# Patient Record
Sex: Female | Born: 1975 | Race: White | Hispanic: No | Marital: Married | Smoking: Former smoker
Health system: Southern US, Community
[De-identification: ages and names within clinical notes are randomized; demographics above are authoritative.]

## PROBLEM LIST (undated history)

## (undated) DIAGNOSIS — I471 Supraventricular tachycardia, unspecified: Secondary | ICD-10-CM

## (undated) DIAGNOSIS — R87612 Low grade squamous intraepithelial lesion on cytologic smear of cervix (LGSIL): Secondary | ICD-10-CM

## (undated) DIAGNOSIS — B009 Herpesviral infection, unspecified: Secondary | ICD-10-CM

## (undated) HISTORY — DX: Supraventricular tachycardia: I47.1

## (undated) HISTORY — DX: Supraventricular tachycardia, unspecified: I47.10

## (undated) HISTORY — PX: BARIATRIC SURGERY: SHX1103

## (undated) HISTORY — DX: Low grade squamous intraepithelial lesion on cytologic smear of cervix (LGSIL): R87.612

## (undated) HISTORY — PX: WISDOM TOOTH EXTRACTION: SHX21

## (undated) HISTORY — DX: Herpesviral infection, unspecified: B00.9

---

## 2006-10-24 ENCOUNTER — Ambulatory Visit: Payer: Self-pay | Admitting: Family Medicine

## 2008-04-12 ENCOUNTER — Ambulatory Visit: Payer: Self-pay | Admitting: Family Medicine

## 2008-04-13 ENCOUNTER — Ambulatory Visit: Payer: Self-pay | Admitting: Family Medicine

## 2009-08-10 ENCOUNTER — Ambulatory Visit: Payer: Self-pay | Admitting: Internal Medicine

## 2010-07-27 ENCOUNTER — Ambulatory Visit: Payer: Self-pay | Admitting: Family Medicine

## 2012-04-29 ENCOUNTER — Ambulatory Visit: Payer: Self-pay | Admitting: Bariatrics

## 2012-04-29 LAB — CBC WITH DIFFERENTIAL/PLATELET
Basophil #: 0 10*3/uL (ref 0.0–0.1)
Basophil %: 0.2 %
Eosinophil #: 0.1 10*3/uL (ref 0.0–0.7)
HCT: 38.9 % (ref 35.0–47.0)
HGB: 13.2 g/dL (ref 12.0–16.0)
Lymphocyte #: 2.9 10*3/uL (ref 1.0–3.6)
Lymphocyte %: 28.3 %
MCH: 31.1 pg (ref 26.0–34.0)
MCV: 91 fL (ref 80–100)
Neutrophil #: 6.5 10*3/uL (ref 1.4–6.5)
Neutrophil %: 64.2 %
Platelet: 267 10*3/uL (ref 150–440)
RBC: 4.26 10*6/uL (ref 3.80–5.20)
WBC: 10.1 10*3/uL (ref 3.6–11.0)

## 2012-04-29 LAB — COMPREHENSIVE METABOLIC PANEL
Albumin: 3.8 g/dL (ref 3.4–5.0)
Alkaline Phosphatase: 67 U/L (ref 50–136)
BUN: 13 mg/dL (ref 7–18)
Calcium, Total: 9 mg/dL (ref 8.5–10.1)
Co2: 29 mmol/L (ref 21–32)
Creatinine: 0.69 mg/dL (ref 0.60–1.30)
EGFR (African American): >60
Glucose: 84 mg/dL (ref 65–99)
Potassium: 3.9 mmol/L (ref 3.5–5.1)
SGPT (ALT): 28 U/L
Sodium: 136 mmol/L (ref 136–145)
Total Protein: 7.6 g/dL (ref 6.4–8.2)

## 2012-04-29 LAB — IRON AND TIBC: Iron Bind.Cap.(Total): 406 ug/dL (ref 250–450)

## 2012-04-29 LAB — MAGNESIUM: Magnesium: 1.9 mg/dL

## 2012-04-29 LAB — HEMOGLOBIN A1C: Hemoglobin A1C: 5.6 % (ref 4.2–6.3)

## 2012-04-29 LAB — PHOSPHORUS: Phosphorus: 3.3 mg/dL (ref 2.5–4.9)

## 2012-04-29 LAB — PROTIME-INR
INR: 0.9
Prothrombin Time: 12.5 secs (ref 11.5–14.7)

## 2012-04-29 LAB — HEPATIC FUNCTION PANEL A (ARMC): Bilirubin, Direct: <0.1 mg/dL (ref 0.00–0.20)

## 2012-04-29 LAB — TSH: Thyroid Stimulating Horm: 1.04 u[IU]/mL

## 2012-04-29 LAB — AMYLASE: Amylase: 29 U/L (ref 25–115)

## 2012-04-29 LAB — FERRITIN: Ferritin (ARMC): 69 ng/mL (ref 8–388)

## 2013-12-02 ENCOUNTER — Emergency Department: Payer: Self-pay | Admitting: Internal Medicine

## 2013-12-02 LAB — URINALYSIS, COMPLETE
Glucose,UR: NEGATIVE mg/dL (ref 0–75)
Leukocyte Esterase: NEGATIVE
Protein: NEGATIVE
RBC,UR: 2 /HPF (ref 0–5)
Squamous Epithelial: 6
WBC UR: 2 /HPF (ref 0–5)

## 2013-12-02 LAB — COMPREHENSIVE METABOLIC PANEL
Albumin: 3.6 g/dL (ref 3.4–5.0)
Alkaline Phosphatase: 57 U/L
Anion Gap: 8 (ref 7–16)
BUN: 9 mg/dL (ref 7–18)
Bilirubin,Total: 0.5 mg/dL (ref 0.2–1.0)
Co2: 24 mmol/L (ref 21–32)
Creatinine: 0.58 mg/dL — ABNORMAL LOW (ref 0.60–1.30)
EGFR (African American): >60
Glucose: 95 mg/dL (ref 65–99)
Osmolality: 270 (ref 275–301)
Total Protein: 6.8 g/dL (ref 6.4–8.2)

## 2013-12-02 LAB — CBC WITH DIFFERENTIAL/PLATELET
Basophil %: 0.2 %
HCT: 37.1 % (ref 35.0–47.0)
Lymphocyte #: 0.9 10*3/uL — ABNORMAL LOW (ref 1.0–3.6)
MCH: 30.5 pg (ref 26.0–34.0)
MCV: 92 fL (ref 80–100)
Monocyte #: 0.5 x10 3/mm (ref 0.2–0.9)
Monocyte %: 5.6 %
Neutrophil #: 7.3 10*3/uL — ABNORMAL HIGH (ref 1.4–6.5)
Neutrophil %: 83.9 %
RBC: 4.05 10*6/uL (ref 3.80–5.20)
RDW: 12.7 % (ref 11.5–14.5)
WBC: 8.7 10*3/uL (ref 3.6–11.0)

## 2013-12-02 LAB — LIPASE, BLOOD: Lipase: 115 U/L (ref 73–393)

## 2014-08-13 ENCOUNTER — Observation Stay: Payer: Self-pay | Admitting: Obstetrics and Gynecology

## 2014-08-13 LAB — URINALYSIS, COMPLETE
BACTERIA: NONE SEEN
BILIRUBIN, UR: NEGATIVE
BLOOD: NEGATIVE
GLUCOSE, UR: NEGATIVE mg/dL (ref 0–75)
Ketone: NEGATIVE
LEUKOCYTE ESTERASE: NEGATIVE
Nitrite: NEGATIVE
Ph: 6 (ref 4.5–8.0)
Protein: NEGATIVE
RBC, UR: NONE SEEN /HPF (ref 0–5)
SPECIFIC GRAVITY: 1.012 (ref 1.003–1.030)
WBC UR: <1 /HPF (ref 0–5)

## 2014-09-08 ENCOUNTER — Inpatient Hospital Stay: Payer: Self-pay

## 2014-09-08 LAB — CBC WITH DIFFERENTIAL/PLATELET
BASOS PCT: 0.1 %
Basophil #: 0 10*3/uL (ref 0.0–0.1)
EOS ABS: 0.1 10*3/uL (ref 0.0–0.7)
Eosinophil %: 1.1 %
HCT: 30.8 % — ABNORMAL LOW (ref 35.0–47.0)
HGB: 9.8 g/dL — AB (ref 12.0–16.0)
LYMPHS ABS: 2.4 10*3/uL (ref 1.0–3.6)
LYMPHS PCT: 23.7 %
MCH: 28.8 pg (ref 26.0–34.0)
MCHC: 31.8 g/dL — ABNORMAL LOW (ref 32.0–36.0)
MCV: 90 fL (ref 80–100)
Monocyte #: 0.8 x10 3/mm (ref 0.2–0.9)
Monocyte %: 7.5 %
NEUTROS ABS: 6.9 10*3/uL — AB (ref 1.4–6.5)
Neutrophil %: 67.6 %
Platelet: 169 10*3/uL (ref 150–440)
RBC: 3.41 10*6/uL — ABNORMAL LOW (ref 3.80–5.20)
RDW: 17 % — AB (ref 11.5–14.5)
WBC: 10.3 10*3/uL (ref 3.6–11.0)

## 2014-09-11 LAB — HEMATOCRIT: HCT: 27.5 % — ABNORMAL LOW (ref 35.0–47.0)

## 2015-05-03 NOTE — H&P (Signed)
L&D Evaluation:  History:  HPI 39 year old G2P0010 at 5827w2d by by D=8 wk US derived EDC of 09/01/2014 presenting with contractions and leaking fluid vs urine.  States she is having increased pressure and constant lower back pain.  Also leaked some urine, denies dysuria.  Pain is constant, also reports increased pelvic pressure.  No VB, +FM.  Denies fevers or chills.    [facility] 1, AMA with negative informaseq XX, GBS bacteruria this pregnancy, history of HSV on valtrex   Presents with back pain, contractions   Patient's Medical History HSV   Patient's Surgical History wisdom teeth extraction   Medications Pre Natal Vitamins  Iron  valtrex   Allergies NKDA   Social History none   Family History Non-Contributory   ROS:  ROS All systems were reviewed.  HEENT, CNS, GI, GU, Respiratory, CV, Renal and Musculoskeletal systems were found to be normal.   Exam:  Vital Signs stable   Urine Protein not completed, U/A pending   General no apparent distress   Mental Status clear   Chest clear   Heart normal sinus rhythm   Abdomen gravid, non-tender   Estimated Fetal Weight Average for gestational age   Fetal Position vtx   Back no CVAT, paraspinal muscle tenderness   Edema no edema   Pelvic no external lesions, 1/thick/high   Mebranes Intact, negative nitrazine   FHT 115, moderate, 1 accels so far, no decels - category I tracing   Ucx absent   Skin dry, no lesions   Impression:  Impression 39 yo at 5427w2d multiple complaints, discomforts of pregnancy   Plan:  Comments 1) Discomforts of pregnancy - will send UA to rule out UTI.  Discussed either adding ambien for sleep at night vs trial of flexeril for back pain.  Tylenol and heat pads  2) Fetus - category I tracing - prepregnancy weight 191 - 55lbs weight gain this pregnancy  3) PNL - O pos / ABSC neg / RI / VZI / RPR NR / HIV neg / HBsAg neg / 1-hr 117 / GBS positive  (by bacteruria) - ampicillin intrapartum  4) TDAP  received 07/02/14  5) HSV - continue valtrex  6) Anemia - on supplemental iron, Hgb 9.9 at 28 weeks  7) LSIL pap - 01/18/14 with +HPV colpo in March multiple acetowhite areas no biopsy, plan to follow up postpartum  8) Disposition - pending UA and fetal monitoring, has follow up scheduled 08/18/2014   Electronic Signatures for Addendum Section:  Lorrene ReidStaebler, Nicole Hafley M (MD) (Signed Addendum 22-Aug-15 00:03)  UA clean, no evidence of UTI.  Rx ambien 10mg  tab po qHS prn insomnia.  Discharge home with routine labor precautions   Electronic Signatures: Lorrene ReidStaebler, Nisaiah Bechtol M (MD)  (Signed 21-Aug-15 23:11)  Authored: L&D Evaluation   Last Updated: 22-Aug-15 00:03 by Lorrene ReidStaebler, Letzy Gullickson M (MD)

## 2017-06-04 ENCOUNTER — Ambulatory Visit (INDEPENDENT_AMBULATORY_CARE_PROVIDER_SITE_OTHER): Payer: BC Managed Care – PPO | Admitting: Obstetrics & Gynecology

## 2017-06-04 ENCOUNTER — Encounter: Payer: Self-pay | Admitting: Obstetrics & Gynecology

## 2017-06-04 VITALS — BP 112/70 | HR 67 | Ht 67.0 in | Wt 169.0 lb

## 2017-06-04 DIAGNOSIS — Z0000 Encounter for general adult medical examination without abnormal findings: Secondary | ICD-10-CM

## 2017-06-04 DIAGNOSIS — Z01419 Encounter for gynecological examination (general) (routine) without abnormal findings: Secondary | ICD-10-CM

## 2017-06-04 DIAGNOSIS — Z87410 Personal history of cervical dysplasia: Secondary | ICD-10-CM

## 2017-06-04 DIAGNOSIS — Z1231 Encounter for screening mammogram for malignant neoplasm of breast: Secondary | ICD-10-CM

## 2017-06-04 DIAGNOSIS — Z1239 Encounter for other screening for malignant neoplasm of breast: Secondary | ICD-10-CM

## 2017-06-04 NOTE — Patient Instructions (Signed)
PAP every year Mammogram every year Labs  (next year due)  Mammogram number to call- [phone]

## 2017-06-04 NOTE — Progress Notes (Signed)
HPI:      Ms. Olivia Patterson is a 41 y.o. G2P0020 who LMP was Patient's last menstrual period was 05/21/2017., she presents today for her annual examination. The patient has no complaints today. The patient is sexually active. Her last pap: approximate date 2017 and was normal and prior LGSIL in 2015. The patient does perform self breast exams.  There is no notable family history of breast or ovarian cancer in her family.  The patient has regular exercise: yes.  The patient denies current symptoms of depression.    GYN History: Contraception: IUD  PMHx: Past Medical History:  Diagnosis Date  . Herpes   . LGSIL on Pap smear of cervix    Past Surgical History:  Procedure Laterality Date  . BARIATRIC SURGERY    . WISDOM TOOTH EXTRACTION     Family History  Problem Relation Age of Onset  . Breast cancer Mother   . Diabetes Father    Social History  Substance Use Topics  . Smoking status: Never Smoker  . Smokeless tobacco: Never Used  . Alcohol use No    Current Outpatient Prescriptions:  .  levonorgestrel (LILETTA, 52 MG,) 18.6 MCG/DAY IUD IUD, 1 each by Intrauterine route once., Disp: , Rfl:  Allergies: Patient has no known allergies.  Review of Systems  Constitutional: Negative for chills, fever and malaise/fatigue.  HENT: Negative for congestion, sinus pain and sore throat.   Eyes: Negative for blurred vision and pain.  Respiratory: Negative for cough and wheezing.   Cardiovascular: Negative for chest pain and leg swelling.  Gastrointestinal: Negative for abdominal pain, constipation, diarrhea, heartburn, nausea and vomiting.  Genitourinary: Negative for dysuria, frequency, hematuria and urgency.  Musculoskeletal: Negative for back pain, joint pain, myalgias and neck pain.  Skin: Negative for itching and rash.  Neurological: Negative for dizziness, tremors and weakness.  Endo/Heme/Allergies: Does not bruise/bleed easily.  Psychiatric/Behavioral: Negative for  depression. The patient is not nervous/anxious and does not have insomnia.     Objective: BP 112/70   Pulse 67   Ht 5\' 7"  (1.702 m)   Wt 169 lb (76.7 kg)   LMP 05/21/2017   BMI 26.47 kg/m   Filed Weights   06/04/17 0915  Weight: 169 lb (76.7 kg)   Body mass index is 26.47 kg/m. Physical Exam  Constitutional: She is oriented to person, place, and time. She appears well-developed and well-nourished. No distress.  Genitourinary: Rectum normal, vagina normal and uterus normal. Pelvic exam was performed with patient supine. There is no rash or lesion on the right labia. There is no rash or lesion on the left labia. Vagina exhibits no lesion. No bleeding in the vagina. Right adnexum does not display mass and does not display tenderness. Left adnexum does not display mass and does not display tenderness. Cervix does not exhibit motion tenderness, lesion, friability or polyp.   Uterus is mobile and anteverted. Uterus is not enlarged or exhibiting a mass.  Genitourinary Comments: IUD STRING 2 cm  HENT:  Head: Normocephalic and atraumatic. Head is without laceration.  Right Ear: Hearing normal.  Left Ear: Hearing normal.  Nose: No epistaxis.  No foreign bodies.  Mouth/Throat: Uvula is midline, oropharynx is clear and moist and mucous membranes are normal.  Eyes: Pupils are equal, round, and reactive to light.  Neck: Normal range of motion. Neck supple. No thyromegaly present.  Cardiovascular: Normal rate and regular rhythm.  Exam reveals no gallop and no friction rub.   No murmur  heard. Pulmonary/Chest: Effort normal and breath sounds normal. No respiratory distress. She has no wheezes. Right breast exhibits no mass, no skin change and no tenderness. Left breast exhibits no mass, no skin change and no tenderness.  Abdominal: Soft. Bowel sounds are normal. She exhibits no distension. There is no tenderness. There is no rebound.  Musculoskeletal: Normal range of motion.  Neurological: She is  alert and oriented to person, place, and time. No cranial nerve deficit.  Skin: Skin is warm and dry.  Psychiatric: She has a normal mood and affect. Judgment normal.  Vitals reviewed.   Assessment:  ANNUAL EXAM 1. Annual physical exam   2. Screening for breast cancer   3. History of cervical dysplasia      Screening Plan:            1.  Cervical Screening-  Pap smear done today  2. Breast screening- Exam annually and mammogram>40 planned   3. Colonoscopy every 10 years, Hemoccult testing - after age 72  4. Labs due next year  5. Counseling for contraception: IUD, year 2  Other:  1. Annual physical exam  2. Screening for breast cancer - MM DIGITAL SCREENING BILATERAL; Future  3. History of cervical dysplasia - Pap IG (Image Guided)    F/U  Return in about 1 year (around 06/04/2018) for Annual.  [name], MD, [facility], [facility] 06/04/2017  9:46 AM

## 2017-06-05 LAB — PAP IG (IMAGE GUIDED): PAP SMEAR COMMENT: 0

## 2018-06-12 ENCOUNTER — Ambulatory Visit (INDEPENDENT_AMBULATORY_CARE_PROVIDER_SITE_OTHER): Payer: BC Managed Care – PPO | Admitting: Obstetrics & Gynecology

## 2018-06-12 ENCOUNTER — Encounter: Payer: Self-pay | Admitting: Obstetrics & Gynecology

## 2018-06-12 VITALS — BP 120/80 | Ht 67.0 in | Wt 169.0 lb

## 2018-06-12 DIAGNOSIS — Z0000 Encounter for general adult medical examination without abnormal findings: Secondary | ICD-10-CM | POA: Diagnosis not present

## 2018-06-12 DIAGNOSIS — Z87410 Personal history of cervical dysplasia: Secondary | ICD-10-CM

## 2018-06-12 DIAGNOSIS — Z1231 Encounter for screening mammogram for malignant neoplasm of breast: Secondary | ICD-10-CM

## 2018-06-12 DIAGNOSIS — Z1239 Encounter for other screening for malignant neoplasm of breast: Secondary | ICD-10-CM

## 2018-06-12 NOTE — Patient Instructions (Signed)
PAP every years Mammogram every year    Call [phone] to schedule at [facility] yearly (with PCP)   

## 2018-06-12 NOTE — Progress Notes (Signed)
HPI:      Olivia Patterson is a 42 y.o. G2P0020 who LMP was Patient's last menstrual period was 06/06/2018., she presents today for her annual examination. The patient has no complaints today.  Liletta working well, year 3. The patient is sexually active. Her last pap: approximate date 2018 normal and 2017 and was abnormal: LGSIL and last mammogram: was normal. The patient does perform self breast exams.  There is notable family history of breast or ovarian cancer in her family.  The patient has regular exercise: yes.  The patient denies current symptoms of depression.   Concern for hearing heartbeat in right ear at times, whooshing sound as well.  GYN History: Contraception: IUD  PMHx: Past Medical History:  Diagnosis Date  . Herpes   . LGSIL on Pap smear of cervix    Past Surgical History:  Procedure Laterality Date  . BARIATRIC SURGERY    . WISDOM TOOTH EXTRACTION     Family History  Problem Relation Age of Onset  . Breast cancer Mother   . Diabetes Father    Social History   Tobacco Use  . Smoking status: Never Smoker  . Smokeless tobacco: Never Used  Substance Use Topics  . Alcohol use: No  . Drug use: No    Current Outpatient Medications:  .  levonorgestrel (LILETTA, 52 MG,) 18.6 MCG/DAY IUD IUD, 1 each by Intrauterine route once., Disp: , Rfl:  Allergies: Patient has no known allergies.  Review of Systems  Constitutional: Negative for chills, fever and malaise/fatigue.  HENT: Negative for congestion, sinus pain and sore throat.   Eyes: Negative for blurred vision and pain.  Respiratory: Negative for cough and wheezing.   Cardiovascular: Negative for chest pain and leg swelling.  Gastrointestinal: Negative for abdominal pain, constipation, diarrhea, heartburn, nausea and vomiting.  Genitourinary: Negative for dysuria, frequency, hematuria and urgency.  Musculoskeletal: Negative for back pain, joint pain, myalgias and neck pain.  Skin: Negative for itching  and rash.  Neurological: Negative for dizziness, tremors and weakness.  Endo/Heme/Allergies: Does not bruise/bleed easily.  Psychiatric/Behavioral: Negative for depression. The patient is not nervous/anxious and does not have insomnia.    Objective: BP 120/80   Ht 5\' 7"  (1.702 m)   Wt 169 lb (76.7 kg)   LMP 06/06/2018   BMI 26.47 kg/m   Filed Weights   06/12/18 1436  Weight: 169 lb (76.7 kg)   Body mass index is 26.47 kg/m. Physical Exam  Constitutional: She is oriented to person, place, and time. She appears well-developed and well-nourished. No distress.  Genitourinary: Rectum normal, vagina normal and uterus normal. Pelvic exam was performed with patient supine. There is no rash or lesion on the right labia. There is no rash or lesion on the left labia. Vagina exhibits no lesion. No bleeding in the vagina. Right adnexum does not display mass and does not display tenderness. Left adnexum does not display mass and does not display tenderness. Cervix does not exhibit motion tenderness, lesion, friability or polyp.   Uterus is mobile and midaxial. Uterus is not enlarged or exhibiting a mass.  Genitourinary Comments: Strings 1 cm  HENT:  Head: Normocephalic and atraumatic. Head is without laceration.  Right Ear: Hearing normal.  Left Ear: Hearing normal.  Nose: No epistaxis.  No foreign bodies.  Mouth/Throat: Uvula is midline, oropharynx is clear and moist and mucous membranes are normal.  Eyes: Pupils are equal, round, and reactive to light.  Neck: Normal range of motion.  Neck supple. No thyromegaly present.  Cardiovascular: Normal rate and regular rhythm. Exam reveals no gallop and no friction rub.  No murmur heard. Pulmonary/Chest: Effort normal and breath sounds normal. No respiratory distress. She has no wheezes. Right breast exhibits no mass, no skin change and no tenderness. Left breast exhibits no mass, no skin change and no tenderness.  Abdominal: Soft. Bowel sounds are  normal. She exhibits no distension. There is no tenderness. There is no rebound.  Musculoskeletal: Normal range of motion.  Neurological: She is alert and oriented to person, place, and time. No cranial nerve deficit.  Skin: Skin is warm and dry.  Psychiatric: She has a normal mood and affect. Judgment normal.  Vitals reviewed.  Assessment:  ANNUAL EXAM 1. History of cervical dysplasia   2. Annual physical exam   3. Screening for breast cancer    Screening Plan:            1.  Cervical Screening-  Pap smear done today  2. Breast screening- Exam annually and mammogram>40 planned   3. Colonoscopy every 10 years, Hemoccult testing - after age 59  4. Labs managed by PCP  5. Counseling for contraception: IUD   6. Pt to see PCP or be referred to ENT or cardiology for symptoms of hearing heartbeat in right ear.    F/U  Return in about 1 year (around 06/13/2019) for Annual.  [name], MD, [facility], [facility] 06/12/2018  2:54 PM

## 2018-06-16 ENCOUNTER — Ambulatory Visit
Admission: RE | Admit: 2018-06-16 | Discharge: 2018-06-16 | Disposition: A | Discharge status: Home / Self Care | Payer: BC Managed Care – PPO | Source: Ambulatory Visit | Attending: Obstetrics & Gynecology | Admitting: Obstetrics & Gynecology

## 2018-06-16 ENCOUNTER — Encounter: Payer: Self-pay | Admitting: Radiology

## 2018-06-16 DIAGNOSIS — Z1231 Encounter for screening mammogram for malignant neoplasm of breast: Secondary | ICD-10-CM | POA: Diagnosis present

## 2018-06-16 DIAGNOSIS — Z1239 Encounter for other screening for malignant neoplasm of breast: Secondary | ICD-10-CM

## 2018-06-16 LAB — PAP IG (IMAGE GUIDED): PAP Smear Comment: 0

## 2018-06-25 ENCOUNTER — Other Ambulatory Visit: Payer: Self-pay | Admitting: *Deleted

## 2018-06-25 ENCOUNTER — Inpatient Hospital Stay
Admission: RE | Admit: 2018-06-25 | Discharge: 2018-06-25 | Disposition: A | Discharge status: Home / Self Care | Payer: Self-pay | Source: Ambulatory Visit | Attending: *Deleted | Admitting: *Deleted

## 2018-06-25 DIAGNOSIS — Z9289 Personal history of other medical treatment: Secondary | ICD-10-CM

## 2018-08-21 ENCOUNTER — Encounter: Payer: Self-pay | Admitting: Emergency Medicine

## 2018-08-21 ENCOUNTER — Ambulatory Visit
Admission: EM | Admit: 2018-08-21 | Discharge: 2018-08-21 | Disposition: A | Discharge status: Home / Self Care | Payer: BC Managed Care – PPO | Attending: Family Medicine | Admitting: Family Medicine

## 2018-08-21 ENCOUNTER — Ambulatory Visit (INDEPENDENT_AMBULATORY_CARE_PROVIDER_SITE_OTHER): Payer: BC Managed Care – PPO

## 2018-08-21 DIAGNOSIS — S92515A Nondisplaced fracture of proximal phalanx of left lesser toe(s), initial encounter for closed fracture: Secondary | ICD-10-CM | POA: Diagnosis not present

## 2018-08-21 DIAGNOSIS — M79672 Pain in left foot: Secondary | ICD-10-CM | POA: Diagnosis not present

## 2018-08-21 NOTE — ED Triage Notes (Signed)
Pt c/o left pinky toe pain. She stumped it in the yard. She had swelling and bruising across her foot.

## 2018-08-21 NOTE — Discharge Instructions (Signed)
Apply ice 20 minutes out of every 2 hours 4-5 times daily for comfort.  Elevate above your heart sufficiently to control swelling and pain

## 2018-08-21 NOTE — ED Provider Notes (Signed)
[facility]    CSN: [id number] Arrival date & time: 08/21/18  4098     History   Chief Complaint Chief Complaint  Patient presents with  . Foot Pain    left    HPI Olivia Patterson is a 42 y.o. female.   HPI  42 year old female presents with foot and toe pain that she had on Sunday 3 days prior to this visit.  She was wearing sandals in her yard when she inadvertently kicked a tree stump that she was about 2 dig up.  States that she had severe pain in her fifth toe.  Since that time she has had swelling and bruising across the top of her foot over the MTP joints 3 through 5.  The small toe is still swollen.  Hurts to walk on it.  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  Morning while driving into school she had   numbness and tingling in that area.         Past Medical History:  Diagnosis Date  . Herpes   . LGSIL on Pap smear of cervix     Patient Active Problem List   Diagnosis Date Noted  . History of cervical dysplasia 06/04/2017    Past Surgical History:  Procedure Laterality Date  . BARIATRIC SURGERY    . WISDOM TOOTH EXTRACTION      OB History    Gravida  2   Para      Term      Preterm      AB  2   Living        SAB      TAB  2   Ectopic      Multiple      Live Births               Home Medications    Prior to Admission medications   Medication Sig Start Date End Date Taking? Authorizing Provider  levonorgestrel (LILETTA, 52 MG,) 18.6 MCG/DAY IUD IUD 1 each by Intrauterine route once.    [provider]    Family History Family History  Problem Relation Age of Onset  . Breast cancer Mother 70  . Diabetes Father     Social History Social History   Tobacco Use  . Smoking status: Former Games developer  . Smokeless tobacco: Never Used  Substance Use Topics  . Alcohol use: No  . Drug use: No     Allergies    Patient has no known allergies.   Review of Systems Review of Systems  Constitutional: Positive for activity change. Negative for chills, fatigue and fever.  Musculoskeletal: Positive for gait problem and joint swelling.  All other systems reviewed and are negative.    Physical Exam Triage Vital Signs ED Triage Vitals  Enc Vitals Group     BP 08/21/18 0843 118/76     Pulse Rate 08/21/18 0843 87     Resp 08/21/18 0843 16     Temp 08/21/18 0843 98.5 F (36.9 C)     Temp Source 08/21/18 0843 Oral     SpO2 08/21/18 0843 99 %     Weight 08/21/18 0842 174 lb (78.9 kg)     Height 08/21/18 0842 5\' 7"  (1.702 m)     Head Circumference --      Peak Flow --  Pain Score 08/21/18 0841 4     Pain Loc --      Pain Edu? --      Excl. in GC? --    No data found.  Updated Vital Signs BP 118/76 (BP Location: Right Arm)   Pulse 87   Temp 98.5 F (36.9 C) (Oral)   Resp 16   Ht 5\' 7"  (1.702 m)   Wt 174 lb (78.9 kg)   SpO2 99%   BMI 27.25 kg/m   Visual Acuity Right Eye Distance:   Left Eye Distance:   Bilateral Distance:    Right Eye Near:   Left Eye Near:    Bilateral Near:     Physical Exam  Constitutional: She is oriented to person, place, and time. She appears well-developed and well-nourished. No distress.  HENT:  Head: Normocephalic.  Eyes: Pupils are equal, round, and reactive to light. Right eye exhibits no discharge. Left eye exhibits no discharge.  Neck: Normal range of motion.  Musculoskeletal: She exhibits edema and tenderness.  Exam of the left foot shows ecchymosis over the dorsum of the foot over toes 3 through 5.  Is mostly over third and fourth toes.  The little toe is swollen without rotational deformity present.  Tenderness to palpation is elicited along the third fourth and fifth MTP.  Maximal tenderness appears to be over the proximal phalanx of the little toe.  Ankle range of motion is full and normal.  She has no discomfort or any abnormal findings of  the second and great toe areas.  Neurovascular function is intact distally  Neurological: She is alert and oriented to person, place, and time.  Skin: Skin is warm and dry. She is not diaphoretic.  Psychiatric: She has a normal mood and affect. Her behavior is normal. Judgment and thought content normal.  Nursing note and vitals reviewed.    UC Treatments / Results  Labs (all labs ordered are listed, but only abnormal results are displayed) Labs Reviewed - No data to display  EKG None  Radiology Dg Foot Complete Left  Result Date: 08/21/2018 CLINICAL DATA:  Fifth toe swelling and ecchymosis over the 3rd/4th MTP joints after kicking a small tree stump on 08/17/2018. Initial encounter. EXAM: LEFT FOOT - COMPLETE 3+ VIEW COMPARISON:  None. FINDINGS: There is an oblique, essentially nondisplaced fracture involving the shaft of the proximal phalanx of the fifth toe with overlying soft tissue swelling. No additional fracture or dislocation is identified. Calcaneal enthesophytes are noted, and there is a small well corticated ossicle anterior to the talar dome. IMPRESSION: Nondisplaced fracture of the proximal phalanx of the fifth toe. Electronically Signed   By: Sebastian Ache M.D.   On: 08/21/2018 09:53    Procedures Procedures (including critical care time)  Medications Ordered in UC Medications - No data to display  Initial Impression / Assessment and Plan / UC Course  I have reviewed the triage vital signs and the nursing notes.  Pertinent labs & imaging results that were available during my care of the patient were reviewed by me and considered in my medical decision making (see chart for details).     Plan: 1. Test/x-ray results and diagnosis reviewed with patient 2. rx as per orders; risks, benefits, potential side effects reviewed with patient 3. Recommend supportive treatment with Apply ice 20 minutes out of every 2 hours 4-5 times daily for comfort.  Buddy tape the toes that  was performed today until the toe is not tender and  painful any longer.  Patient was given a postop shoe for more comfortable ambulation.  Need to elevate above the level heart sufficiently to control swelling and pain.  Use Tylenol combined with ibuprofen for pain control.  Follow-up with podiatrist if not improving after a week or 2. 4. F/u prn if symptoms worsen or don't improve  Final Clinical Impressions(s) / UC Diagnoses   Final diagnoses:  Closed nondisplaced fracture of proximal phalanx of lesser toe of left foot, initial encounter     Discharge Instructions     Apply ice 20 minutes out of every 2 hours 4-5 times daily for comfort.  Elevate above your heart sufficiently to control swelling and pain    ED Prescriptions    None     Controlled Substance Prescriptions [place] Controlled Substance Registry consulted? Not Applicable   Lutricia FeilRoemer, Michaelia Beilfuss P, PA-C 08/21/18 1251

## 2018-11-19 ENCOUNTER — Telehealth: Payer: Self-pay

## 2018-11-19 ENCOUNTER — Other Ambulatory Visit: Payer: Self-pay | Admitting: Obstetrics & Gynecology

## 2018-11-19 MED ORDER — VALACYCLOVIR HCL 500 MG PO TABS: 500.0000 mg | ORAL_TABLET | Freq: Two times a day (BID) | ORAL | 6 refills | Status: DC

## 2018-11-19 NOTE — Telephone Encounter (Signed)
Can you send this in for her?

## 2018-11-19 NOTE — Telephone Encounter (Signed)
[facility]

## 2018-11-19 NOTE — Telephone Encounter (Signed)
Left message so pt would know her rx has been sent to pharmacy

## 2018-11-19 NOTE — Telephone Encounter (Signed)
Pt calling for refill of valacyclovir.  She hasn't needed it in a long time.  She thinks that's why it's not showing up on her med list.  [phone]

## 2019-06-03 ENCOUNTER — Other Ambulatory Visit: Payer: Self-pay | Admitting: Obstetrics & Gynecology

## 2019-06-03 DIAGNOSIS — Z1231 Encounter for screening mammogram for malignant neoplasm of breast: Secondary | ICD-10-CM

## 2019-06-15 ENCOUNTER — Ambulatory Visit (INDEPENDENT_AMBULATORY_CARE_PROVIDER_SITE_OTHER): Payer: BC Managed Care – PPO | Admitting: Obstetrics & Gynecology

## 2019-06-15 ENCOUNTER — Other Ambulatory Visit (HOSPITAL_COMMUNITY)
Admission: RE | Admit: 2019-06-15 | Discharge: 2019-06-15 | Disposition: A | Discharge status: Home / Self Care | Payer: BC Managed Care – PPO | Source: Ambulatory Visit | Attending: Obstetrics & Gynecology | Admitting: Obstetrics & Gynecology

## 2019-06-15 ENCOUNTER — Other Ambulatory Visit: Payer: Self-pay

## 2019-06-15 ENCOUNTER — Encounter: Payer: Self-pay | Admitting: Obstetrics & Gynecology

## 2019-06-15 VITALS — BP 100/60 | Ht 67.0 in | Wt 187.0 lb

## 2019-06-15 DIAGNOSIS — Z87410 Personal history of cervical dysplasia: Secondary | ICD-10-CM

## 2019-06-15 DIAGNOSIS — Z1329 Encounter for screening for other suspected endocrine disorder: Secondary | ICD-10-CM

## 2019-06-15 DIAGNOSIS — Z131 Encounter for screening for diabetes mellitus: Secondary | ICD-10-CM

## 2019-06-15 DIAGNOSIS — Z13220 Encounter for screening for lipoid disorders: Secondary | ICD-10-CM

## 2019-06-15 DIAGNOSIS — Z1239 Encounter for other screening for malignant neoplasm of breast: Secondary | ICD-10-CM

## 2019-06-15 DIAGNOSIS — Z1321 Encounter for screening for nutritional disorder: Secondary | ICD-10-CM

## 2019-06-15 DIAGNOSIS — Z01419 Encounter for gynecological examination (general) (routine) without abnormal findings: Secondary | ICD-10-CM

## 2019-06-15 NOTE — Patient Instructions (Addendum)
PAP every year Mammogram every year Labs soon

## 2019-06-15 NOTE — Progress Notes (Signed)
HPI:      Ms. Fredia SorrowDanette D Jerrett is a 43 y.o. G2P0020 who LMP was Patient's last menstrual period was 06/01/2019., she presents today for her annual examination. The patient has no complaints today. The patient is sexually active. Her last pap: approximate date 2019 and was normal and 2017 LGSIL and last mammogram: approximate date 2019 and was normal. The patient does perform self breast exams.  There is no notable family history of breast or ovarian cancer in her family.  The patient has regular exercise: yes.  The patient denies current symptoms of depression.   Reg light periods.  Some weight gain and stress since corona this year.  She is a Runner, broadcasting/film/videoteacher.  GYN History: Contraception: IUD year 4 Liletta  PMHx: Past Medical History:  Diagnosis Date  . Herpes   . LGSIL on Pap smear of cervix    Past Surgical History:  Procedure Laterality Date  . BARIATRIC SURGERY    . WISDOM TOOTH EXTRACTION     Family History  Problem Relation Age of Onset  . Breast cancer Mother 4260  . Diabetes Father    Social History   Tobacco Use  . Smoking status: Former Games developermoker  . Smokeless tobacco: Never Used  Substance Use Topics  . Alcohol use: No  . Drug use: No    Current Outpatient Medications:  .  levonorgestrel (LILETTA, 52 MG,) 18.6 MCG/DAY IUD IUD, 1 each by Intrauterine route once., Disp: , Rfl:  Allergies: Patient has no known allergies.  Review of Systems  Constitutional: Negative for chills, fever and malaise/fatigue.  HENT: Negative for congestion, sinus pain and sore throat.   Eyes: Negative for blurred vision and pain.  Respiratory: Negative for cough and wheezing.   Cardiovascular: Negative for chest pain and leg swelling.  Gastrointestinal: Negative for abdominal pain, constipation, diarrhea, heartburn, nausea and vomiting.  Genitourinary: Negative for dysuria, frequency, hematuria and urgency.  Musculoskeletal: Negative for back pain, joint pain, myalgias and neck pain.  Skin:  Negative for itching and rash.  Neurological: Negative for dizziness, tremors and weakness.  Endo/Heme/Allergies: Does not bruise/bleed easily.  Psychiatric/Behavioral: Negative for depression. The patient is not nervous/anxious and does not have insomnia.     Objective: BP 100/60   Ht 5\' 7"  (1.702 m)   Wt 187 lb (84.8 kg)   LMP 06/01/2019   BMI 29.29 kg/m   Filed Weights   06/15/19 1456  Weight: 187 lb (84.8 kg)   Body mass index is 29.29 kg/m. Physical Exam Constitutional:      General: She is not in acute distress.    Appearance: She is well-developed.  Genitourinary:     Pelvic exam was performed with patient supine.     Vagina, uterus and rectum normal.     No lesions in the vagina.     No vaginal bleeding.     No cervical motion tenderness, friability, lesion or polyp.     Uterus is mobile.     Uterus is not enlarged.     No uterine mass detected.    Uterus is midaxial.     No right or left adnexal mass present.     Right adnexa not tender.     Left adnexa not tender.  HENT:     Head: Normocephalic and atraumatic. No laceration.     Right Ear: Hearing normal.     Left Ear: Hearing normal.     Mouth/Throat:     Pharynx: Uvula midline.  Eyes:  Pupils: Pupils are equal, round, and reactive to light.  Neck:     Musculoskeletal: Normal range of motion and neck supple.     Thyroid: No thyromegaly.  Cardiovascular:     Rate and Rhythm: Normal rate and regular rhythm.     Heart sounds: No murmur. No friction rub. No gallop.   Pulmonary:     Effort: Pulmonary effort is normal. No respiratory distress.     Breath sounds: Normal breath sounds. No wheezing.  Chest:     Breasts:        Right: No mass, skin change or tenderness.        Left: No mass, skin change or tenderness.  Abdominal:     General: Bowel sounds are normal. There is no distension.     Palpations: Abdomen is soft.     Tenderness: There is no abdominal tenderness. There is no rebound.   Musculoskeletal: Normal range of motion.  Neurological:     Mental Status: She is alert and oriented to person, place, and time.     Cranial Nerves: No cranial nerve deficit.  Skin:    General: Skin is warm and dry.  Psychiatric:        Judgment: Judgment normal.  Vitals signs reviewed.     Assessment:  ANNUAL EXAM 1. Women's annual routine gynecological examination   2. History of cervical dysplasia   3. Screening for breast cancer      Screening Plan:            1.  Cervical Screening-  Pap smear done today and yearly due to prior abnormal PAP  2. Breast screening- Exam annually and mammogram>40 planned   3. Colonoscopy every 10 years, Hemoccult testing - after age 76  4. Labs To return fasting at a later date  5. Counseling for contraception: IUD  Plan exchange next year Pros and cons of IUD, alternatives discussed       F/U  Return in about 1 year (around 06/14/2020) for Annual.  Barnett Applebaum, MD, [facility], [facility] 06/15/2019  3:17 PM

## 2019-06-16 ENCOUNTER — Other Ambulatory Visit: Payer: BC Managed Care – PPO

## 2019-06-16 DIAGNOSIS — Z131 Encounter for screening for diabetes mellitus: Secondary | ICD-10-CM

## 2019-06-16 DIAGNOSIS — Z1329 Encounter for screening for other suspected endocrine disorder: Secondary | ICD-10-CM

## 2019-06-16 DIAGNOSIS — Z13220 Encounter for screening for lipoid disorders: Secondary | ICD-10-CM

## 2019-06-16 DIAGNOSIS — Z1321 Encounter for screening for nutritional disorder: Secondary | ICD-10-CM

## 2019-06-17 LAB — GLUCOSE, FASTING: Glucose, Plasma: 84 mg/dL (ref 65–99)

## 2019-06-17 LAB — CYTOLOGY - PAP: Diagnosis: NEGATIVE

## 2019-06-17 LAB — LIPID PANEL
Chol/HDL Ratio: 3.7 ratio (ref 0.0–4.4)
Cholesterol, Total: 217 mg/dL — ABNORMAL HIGH (ref 100–199)
HDL: 59 mg/dL (ref >39)
LDL Calculated: 142 mg/dL — ABNORMAL HIGH (ref 0–99)
Triglycerides: 81 mg/dL (ref 0–149)
VLDL Cholesterol Cal: 16 mg/dL (ref 5–40)

## 2019-06-17 LAB — TSH: TSH: 0.837 u[IU]/mL (ref 0.450–4.500)

## 2019-06-17 LAB — VITAMIN D 25 HYDROXY (VIT D DEFICIENCY, FRACTURES): Vit D, 25-Hydroxy: 38.2 ng/mL (ref 30.0–100.0)

## 2019-06-18 ENCOUNTER — Ambulatory Visit
Admission: RE | Admit: 2019-06-18 | Discharge: 2019-06-18 | Disposition: A | Discharge status: Home / Self Care | Payer: BC Managed Care – PPO | Source: Ambulatory Visit | Attending: Obstetrics & Gynecology | Admitting: Obstetrics & Gynecology

## 2019-06-18 ENCOUNTER — Other Ambulatory Visit: Payer: Self-pay

## 2019-06-18 DIAGNOSIS — Z1231 Encounter for screening mammogram for malignant neoplasm of breast: Secondary | ICD-10-CM | POA: Insufficient documentation

## 2019-06-19 ENCOUNTER — Other Ambulatory Visit: Payer: Self-pay | Admitting: Obstetrics & Gynecology

## 2019-06-19 DIAGNOSIS — N6320 Unspecified lump in the left breast, unspecified quadrant: Secondary | ICD-10-CM

## 2019-06-19 DIAGNOSIS — R928 Other abnormal and inconclusive findings on diagnostic imaging of breast: Secondary | ICD-10-CM

## 2019-06-30 ENCOUNTER — Other Ambulatory Visit: Payer: Self-pay

## 2019-06-30 ENCOUNTER — Ambulatory Visit
Admission: RE | Admit: 2019-06-30 | Discharge: 2019-06-30 | Disposition: A | Discharge status: Home / Self Care | Payer: BC Managed Care – PPO | Source: Ambulatory Visit | Attending: Obstetrics & Gynecology | Admitting: Obstetrics & Gynecology

## 2019-06-30 DIAGNOSIS — N6320 Unspecified lump in the left breast, unspecified quadrant: Secondary | ICD-10-CM | POA: Diagnosis present

## 2019-06-30 DIAGNOSIS — R928 Other abnormal and inconclusive findings on diagnostic imaging of breast: Secondary | ICD-10-CM

## 2019-12-23 DIAGNOSIS — B001 Herpesviral vesicular dermatitis: Secondary | ICD-10-CM | POA: Insufficient documentation

## 2019-12-23 DIAGNOSIS — G479 Sleep disorder, unspecified: Secondary | ICD-10-CM | POA: Insufficient documentation

## 2020-02-20 ENCOUNTER — Ambulatory Visit: Payer: BC Managed Care – PPO | Attending: Internal Medicine

## 2020-02-20 DIAGNOSIS — Z23 Encounter for immunization: Secondary | ICD-10-CM | POA: Insufficient documentation

## 2020-02-20 NOTE — Progress Notes (Signed)
   Covid-19 Vaccination Clinic  Name:  THU BAGGETT    MRN: [id number] DOB: 1976-02-20  02/20/2020  Ms. Lafontant was observed post Covid-19 immunization for 15 minutes without incidence. She was provided with Vaccine Information Sheet and instruction to access the V-Safe system.   Ms. Goforth was instructed to call 911 with any severe reactions post vaccine: Marland Kitchen Difficulty breathing  . Swelling of your face and throat  . A fast heartbeat  . A bad rash all over your body  . Dizziness and weakness    Immunizations Administered    Name Date Dose VIS Date Route   Moderna COVID-19 Vaccine 02/20/2020 10:50 AM 0.5 mL 11/24/2019 Intramuscular   Manufacturer: Moderna   Lot: [id number]   NDC: 03795-583-16

## 2020-03-19 ENCOUNTER — Emergency Department: Payer: BC Managed Care – PPO

## 2020-03-19 ENCOUNTER — Emergency Department
Admission: EM | Admit: 2020-03-19 | Discharge: 2020-03-19 | Disposition: A | Discharge status: Home / Self Care | Payer: BC Managed Care – PPO | Attending: Emergency Medicine | Admitting: Emergency Medicine

## 2020-03-19 ENCOUNTER — Other Ambulatory Visit: Payer: Self-pay

## 2020-03-19 ENCOUNTER — Ambulatory Visit: Payer: BC Managed Care – PPO | Attending: Internal Medicine

## 2020-03-19 DIAGNOSIS — M436 Torticollis: Secondary | ICD-10-CM | POA: Diagnosis not present

## 2020-03-19 DIAGNOSIS — M542 Cervicalgia: Secondary | ICD-10-CM | POA: Diagnosis present

## 2020-03-19 DIAGNOSIS — Z87891 Personal history of nicotine dependence: Secondary | ICD-10-CM | POA: Diagnosis not present

## 2020-03-19 DIAGNOSIS — Z23 Encounter for immunization: Secondary | ICD-10-CM

## 2020-03-19 MED ORDER — DEXAMETHASONE SODIUM PHOSPHATE 10 MG/ML IJ SOLN
10.0000 mg | Freq: Once | INTRAMUSCULAR | Status: AC
  Administered 2020-03-19: 10 mg via INTRAMUSCULAR
  Filled 2020-03-19: qty 1

## 2020-03-19 MED ORDER — HYDROMORPHONE HCL 1 MG/ML IJ SOLN
1.0000 mg | Freq: Once | INTRAMUSCULAR | Status: AC
  Administered 2020-03-19: 1 mg via INTRAMUSCULAR
  Filled 2020-03-19: qty 1

## 2020-03-19 MED ORDER — ORPHENADRINE CITRATE 30 MG/ML IJ SOLN
60.0000 mg | Freq: Two times a day (BID) | INTRAMUSCULAR | Status: DC
  Administered 2020-03-19: 60 mg via INTRAMUSCULAR
  Filled 2020-03-19: qty 2

## 2020-03-19 MED ORDER — OXYCODONE-ACETAMINOPHEN 7.5-325 MG PO TABS: 1.0000 | ORAL_TABLET | Freq: Four times a day (QID) | ORAL | 0 refills | Status: DC | PRN

## 2020-03-19 MED ORDER — METHYLPREDNISOLONE 4 MG PO TBPK: ORAL_TABLET | ORAL | 0 refills | Status: DC

## 2020-03-19 MED ORDER — CYCLOBENZAPRINE HCL 10 MG PO TABS: 10.0000 mg | ORAL_TABLET | Freq: Three times a day (TID) | ORAL | 0 refills | Status: DC | PRN

## 2020-03-19 NOTE — Discharge Instructions (Signed)
Follow discharge care instruction take medication as directed. °

## 2020-03-19 NOTE — Progress Notes (Signed)
   Covid-19 Vaccination Clinic  Name:  TIRSA GAIL    MRN: [id number] DOB: 1976/06/03  03/19/2020  Ms. Saylor was observed post Covid-19 immunization for 15 minutes without incident. She was provided with Vaccine Information Sheet and instruction to access the V-Safe system.   Ms. Hollon was instructed to call 911 with any severe reactions post vaccine: Marland Kitchen Difficulty breathing  . Swelling of face and throat  . A fast heartbeat  . A bad rash all over body  . Dizziness and weakness   Immunizations Administered    Name Date Dose VIS Date Route   Moderna COVID-19 Vaccine 03/19/2020  9:08 AM 0.5 mL 11/24/2019 Intramuscular   Manufacturer: Moderna   Lot: [id number]   NDC: 39688-648-47

## 2020-03-19 NOTE — ED Triage Notes (Signed)
Pt states neck pain x 3, pointes to L and R posterior neck. Slept in her child's bed and woke up with soreness. A&O, ambulatory.

## 2020-03-19 NOTE — ED Notes (Signed)
Pt c/o neck pain x 3 days, pt reports difficulty moving neck side to side and up and down due to the pain. Pt states the pain is a throbbing pain. Took Tylenol this morning.

## 2020-03-19 NOTE — ED Provider Notes (Signed)
[facility] Emergency Department Provider Note   ____________________________________________   First MD Initiated Contact with Patient 03/19/20 1027     (approximate)  I have reviewed the triage vital signs and the nursing notes.   HISTORY  Chief Complaint Neck Pain    HPI Olivia Patterson is a 44 y.o. female patient complain of 3 days of bilateral neck pain after sleeping in her daughter's bed. Patient states any movement of the neck causes excruciating pain. Patient denies radicular component to her neck pain. Patient also complained of difficulty swallowing. Patient denies sore throat. Patient state unable to sleep due to pain/discomfort. Patient rates the pain as a 10/10. Patient described pain as "achy". No palliative measure for complaint.         Past Medical History:  Diagnosis Date  . Herpes   . LGSIL on Pap smear of cervix     Patient Active Problem List   Diagnosis Date Noted  . History of cervical dysplasia 06/04/2017    Past Surgical History:  Procedure Laterality Date  . BARIATRIC SURGERY    . WISDOM TOOTH EXTRACTION      Prior to Admission medications   Medication Sig Start Date End Date Taking? Authorizing Provider  cyclobenzaprine (FLEXERIL) 10 MG tablet Take 1 tablet (10 mg total) by mouth 3 (three) times daily as needed. 03/19/20   Joni Reining, PA-C  levonorgestrel (LILETTA, 52 MG,) 18.6 MCG/DAY IUD IUD 1 each by Intrauterine route once.    [provider]  methylPREDNISolone (MEDROL DOSEPAK) 4 MG TBPK tablet Take Tapered dose as directed 03/19/20   Joni Reining, PA-C  oxyCODONE-acetaminophen (PERCOCET) 7.5-325 MG tablet Take 1 tablet by mouth every 6 (six) hours as needed. 03/19/20   Joni Reining, PA-C    Allergies Patient has no known allergies.  Family History  Problem Relation Age of Onset  . Breast cancer Mother 42  . Diabetes Father     Social History Social History   Tobacco Use  .  Smoking status: Former Games developer  . Smokeless tobacco: Never Used  Substance Use Topics  . Alcohol use: No  . Drug use: No    Review of Systems Constitutional: No fever/chills Eyes: No visual changes. ENT: No sore throat. Dysphagia. Cardiovascular: Denies chest pain. Respiratory: Denies shortness of breath. Gastrointestinal: No abdominal pain.  No nausea, no vomiting.  No diarrhea.  No constipation. Genitourinary: Negative for dysuria. Musculoskeletal: Bilateral neck pain.  Skin: Negative for rash. Neurological: Negative for headaches, focal weakness or numbness. ____________________________________________   PHYSICAL EXAM:  VITAL SIGNS: ED Triage Vitals  Enc Vitals Group     BP 03/19/20 1008 114/63     Pulse Rate 03/19/20 1008 86     Resp 03/19/20 1008 18     Temp 03/19/20 1008 98.6 F (37 C)     Temp Source 03/19/20 1008 Oral     SpO2 03/19/20 1008 100 %     Weight 03/19/20 1008 194 lb (88 kg)     Height 03/19/20 1008 5\' 7"  (1.702 m)     Head Circumference --      Peak Flow --      Pain Score 03/19/20 1015 10     Pain Loc --      Pain Edu? --      Excl. in GC? --     Constitutional: Alert and oriented. Well appearing and in no acute distress. Eyes: Conjunctivae are normal. PERRL. EOMI. Head: Atraumatic. Nose:  No congestion/rhinnorhea. Mouth/Throat: Mucous membranes are moist.  Oropharynx non-erythematous. Neck: No stridor.  No cervical spine tenderness to palpation. Decreased range of motion all fields Hematological/Lymphatic/Immunilogical: No cervical lymphadenopathy. Cardiovascular: Normal rate, regular rhythm. Grossly normal heart sounds.  Good peripheral circulation. Respiratory: Normal respiratory effort.  No retractions. Lungs CTAB. Neurologic:  Normal speech and language. No gross focal neurologic deficits are appreciated. No gait instability. Skin:  Skin is warm, dry and intact. No rash noted. Psychiatric: Mood and affect are normal. Speech and behavior  are normal.  ____________________________________________   LABS (all labs ordered are listed, but only abnormal results are displayed)  Labs Reviewed - No data to display ____________________________________________  EKG   ____________________________________________  RADIOLOGY  ED MD interpretation:    Official radiology report(s): DG Neck Soft Tissue  Result Date: 03/19/2020 CLINICAL DATA:  Posterior neck pain and odynophagia. EXAM: NECK SOFT TISSUES - 1+ VIEW COMPARISON:  None. FINDINGS: There is no evidence of retropharyngeal soft tissue swelling or epiglottic enlargement. The cervical airway is unremarkable and no radio-opaque foreign body identified. No evidence of prevertebral soft tissue swelling. IMPRESSION: Negative. Electronically Signed   By: Aletta Edouard M.D.   On: 03/19/2020 11:25    ____________________________________________   PROCEDURES  Procedure(s) performed (including Critical Care):  Procedures   ____________________________________________   INITIAL IMPRESSION / ASSESSMENT AND PLAN / ED COURSE  As part of my medical decision making, I reviewed the following data within the [facility]     Patient presents with 3 days leg pain which occurred upon a.m. awakening.  Patient also complain of dysphagia with onset of neck pain.  Discussed negative soft tissue x-ray with patient.  Patient physical exam is consistent with torticollis.  Patient is planning well to Norflex, Dilaudid, and Decadron.  Patient given discharge care instruction and work note.  Patient advised to take medication and follow-up with PCP if condition persist.    Olivia Patterson was evaluated in Emergency Department on 03/19/2020 for the symptoms described in the history of present illness. She was evaluated in the context of the global COVID-19 pandemic, which necessitated consideration that the patient might be at risk for infection with the SARS-CoV-2 virus that  causes COVID-19. Institutional protocols and algorithms that pertain to the evaluation of patients at risk for COVID-19 are in a state of rapid change based on information released by regulatory bodies including the CDC and federal and state organizations. These policies and algorithms were followed during the patient's care in the ED.       ____________________________________________   FINAL CLINICAL IMPRESSION(S) / ED DIAGNOSES  Final diagnoses:  Torticollis, acute     ED Discharge Orders         Ordered    oxyCODONE-acetaminophen (PERCOCET) 7.5-325 MG tablet  Every 6 hours PRN     03/19/20 1136    cyclobenzaprine (FLEXERIL) 10 MG tablet  3 times daily PRN     03/19/20 1137    methylPREDNISolone (MEDROL DOSEPAK) 4 MG TBPK tablet     03/19/20 1137           Note:  This document was prepared using Dragon voice recognition software and may include unintentional dictation errors.    Sable Feil, PA-C 03/19/20 1140    Lavonia Drafts, MD 03/19/20 1149

## 2020-04-04 ENCOUNTER — Other Ambulatory Visit: Payer: Self-pay | Admitting: Obstetrics & Gynecology

## 2020-06-23 ENCOUNTER — Other Ambulatory Visit: Payer: Self-pay

## 2020-06-23 ENCOUNTER — Other Ambulatory Visit (HOSPITAL_COMMUNITY)
Admission: RE | Admit: 2020-06-23 | Discharge: 2020-06-23 | Disposition: A | Discharge status: Home / Self Care | Payer: BC Managed Care – PPO | Source: Ambulatory Visit | Attending: Obstetrics & Gynecology | Admitting: Obstetrics & Gynecology

## 2020-06-23 ENCOUNTER — Ambulatory Visit (INDEPENDENT_AMBULATORY_CARE_PROVIDER_SITE_OTHER): Payer: BC Managed Care – PPO | Admitting: Obstetrics & Gynecology

## 2020-06-23 ENCOUNTER — Encounter: Payer: Self-pay | Admitting: Obstetrics & Gynecology

## 2020-06-23 VITALS — BP 100/60 | Ht 67.0 in | Wt 191.0 lb

## 2020-06-23 DIAGNOSIS — Z87410 Personal history of cervical dysplasia: Secondary | ICD-10-CM | POA: Diagnosis present

## 2020-06-23 DIAGNOSIS — Z1231 Encounter for screening mammogram for malignant neoplasm of breast: Secondary | ICD-10-CM

## 2020-06-23 DIAGNOSIS — Z01419 Encounter for gynecological examination (general) (routine) without abnormal findings: Secondary | ICD-10-CM | POA: Diagnosis not present

## 2020-06-23 DIAGNOSIS — E7800 Pure hypercholesterolemia, unspecified: Secondary | ICD-10-CM

## 2020-06-23 NOTE — Patient Instructions (Signed)
PAP every year Mammogram every year    Call [phone] to schedule at [facility] up to date    Re-check cholesterol level  Thank you for choosing [facility]. As part of our ongoing efforts to improve patient experience, we would appreciate your feedback. Please fill out the short survey that you will receive by mail or MyChart. Your opinion is important to [place]!

## 2020-06-23 NOTE — Progress Notes (Signed)
HPI:      Ms. Olivia Patterson is a 44 y.o. G2P0020 who LMP was Patient's last menstrual period was 06/09/2020., she presents today for her annual examination. The patient has no complaints today. The patient is sexually active. Her last pap: approximate date 2020 and was normal and prior LGSIL 2017; and last mammogram: was normal. The patient does perform self breast exams.  There is notable family history of breast or ovarian cancer in her family - Mother age 64 breast cancer dx..  The patient has regular exercise: yes.  The patient denies current symptoms of depression.    GYN History: Contraception: IUD - Liletta yr 5    Light periods, no change over the last year  PMHx: Past Medical History:  Diagnosis Date  . Herpes   . LGSIL on Pap smear of cervix    Past Surgical History:  Procedure Laterality Date  . BARIATRIC SURGERY    . WISDOM TOOTH EXTRACTION     Family History  Problem Relation Age of Onset  . Breast cancer Mother 69  . Diabetes Father    Social History   Tobacco Use  . Smoking status: Former Games developer  . Smokeless tobacco: Never Used  Vaping Use  . Vaping Use: Never used  Substance Use Topics  . Alcohol use: No  . Drug use: No    Current Outpatient Medications:  .  levonorgestrel (LILETTA, 52 MG,) 18.6 MCG/DAY IUD IUD, 1 each by Intrauterine route once., Disp: , Rfl:  .  valACYclovir (VALTREX) 500 MG tablet, TAKE 1 TABLET(500 MG) BY MOUTH TWICE DAILY FOR 3 DAYS, Disp: 6 tablet, Rfl: 6 .  cyclobenzaprine (FLEXERIL) 10 MG tablet, Take 1 tablet (10 mg total) by mouth 3 (three) times daily as needed. (Patient not taking: Reported on 06/23/2020), Disp: 15 tablet, Rfl: 0 .  methylPREDNISolone (MEDROL DOSEPAK) 4 MG TBPK tablet, Take Tapered dose as directed, Disp: 21 tablet, Rfl: 0 .  oxyCODONE-acetaminophen (PERCOCET) 7.5-325 MG tablet, Take 1 tablet by mouth every 6 (six) hours as needed. (Patient not taking: Reported on 06/23/2020), Disp: 20 tablet, Rfl:  0 Allergies: Patient has no known allergies.  Review of Systems  Constitutional: Negative for chills, fever and malaise/fatigue.  HENT: Negative for congestion, sinus pain and sore throat.   Eyes: Negative for blurred vision and pain.  Respiratory: Negative for cough and wheezing.   Cardiovascular: Negative for chest pain and leg swelling.  Gastrointestinal: Negative for abdominal pain, constipation, diarrhea, heartburn, nausea and vomiting.  Genitourinary: Negative for dysuria, frequency, hematuria and urgency.  Musculoskeletal: Negative for back pain, joint pain, myalgias and neck pain.  Skin: Negative for itching and rash.  Neurological: Negative for dizziness, tremors and weakness.  Endo/Heme/Allergies: Does not bruise/bleed easily.  Psychiatric/Behavioral: Negative for depression. The patient is not nervous/anxious and does not have insomnia.     Objective: BP 100/60   Ht 5\' 7"  (1.702 m)   Wt 191 lb (86.6 kg)   LMP 06/09/2020   BMI 29.91 kg/m   Filed Weights   06/23/20 1310  Weight: 191 lb (86.6 kg)   Body mass index is 29.91 kg/m. Physical Exam Constitutional:      General: She is not in acute distress.    Appearance: She is well-developed.  Genitourinary:     Pelvic exam was performed with patient supine.     Urethra, bladder, vagina, uterus and rectum normal.     No lesions in the vagina.     No vaginal  bleeding.     No cervical motion tenderness, friability, lesion or polyp.     IUD strings visualized.     Uterus is mobile.     Uterus is not enlarged.     No uterine mass detected.    Uterus is midaxial.     No right or left adnexal mass present.     Right adnexa not tender.     Left adnexa not tender.  HENT:     Head: Normocephalic and atraumatic. No laceration.     Right Ear: Hearing normal.     Left Ear: Hearing normal.     Mouth/Throat:     Pharynx: Uvula midline.  Eyes:     Pupils: Pupils are equal, round, and reactive to light.  Neck:      Thyroid: No thyromegaly.  Cardiovascular:     Rate and Rhythm: Normal rate and regular rhythm.     Heart sounds: No murmur heard.  No friction rub. No gallop.   Pulmonary:     Effort: Pulmonary effort is normal. No respiratory distress.     Breath sounds: Normal breath sounds. No wheezing.  Chest:     Breasts:        Right: No mass, skin change or tenderness.        Left: No mass, skin change or tenderness.  Abdominal:     General: Bowel sounds are normal. There is no distension.     Palpations: Abdomen is soft.     Tenderness: There is no abdominal tenderness. There is no rebound.  Musculoskeletal:        General: Normal range of motion.     Cervical back: Normal range of motion and neck supple.  Neurological:     Mental Status: She is alert and oriented to person, place, and time.     Cranial Nerves: No cranial nerve deficit.  Skin:    General: Skin is warm and dry.  Psychiatric:        Judgment: Judgment normal.  Vitals reviewed.     Assessment:  ANNUAL EXAM 1. Women's annual routine gynecological examination   2. History of cervical dysplasia   3. Encounter for screening mammogram for malignant neoplasm of breast   4. Hypercholesterolemia      Screening Plan:            1.  Cervical Screening-  Pap smear done today  2. Breast screening- Exam annually and mammogram>40 planned   3. Colonoscopy every 10 years, Hemoccult testing - after age 62  4. Labs To return fasting at a later date  5. Counseling for contraception: IUD  Cont for 6-7 yrs ad then exchange Sooner if worsening periods   6. Hypercholesterolemia - Lifestyle measures discussed - Lipid panel; Future      F/U  Return in about 1 year (around 06/23/2021) for Annual; also time for lab appt one morning fasting.  [name], MD, [facility], [facility] 06/23/2020  1:27 PM

## 2020-06-29 LAB — CYTOLOGY - PAP: Diagnosis: NEGATIVE

## 2020-09-27 ENCOUNTER — Telehealth: Payer: Self-pay

## 2020-09-27 NOTE — Telephone Encounter (Signed)
-----   Message from Nadara Mustard, MD sent at 09/19/2020  8:46 AM EDT ----- Regarding: MMG Received notice she has not received MMG yet as ordered at her Annual. Please check and encourage her to do this, and document conversation.

## 2020-09-27 NOTE — Telephone Encounter (Signed)
Pt aware to schedule mammogram 

## 2020-10-06 ENCOUNTER — Other Ambulatory Visit: Payer: Self-pay

## 2020-10-06 ENCOUNTER — Ambulatory Visit
Admission: RE | Admit: 2020-10-06 | Discharge: 2020-10-06 | Disposition: A | Discharge status: Home / Self Care | Payer: BC Managed Care – PPO | Source: Ambulatory Visit | Attending: Obstetrics & Gynecology | Admitting: Obstetrics & Gynecology

## 2020-10-06 DIAGNOSIS — Z1231 Encounter for screening mammogram for malignant neoplasm of breast: Secondary | ICD-10-CM | POA: Insufficient documentation

## 2020-10-10 ENCOUNTER — Other Ambulatory Visit: Payer: Self-pay | Admitting: Obstetrics & Gynecology

## 2020-10-10 DIAGNOSIS — R928 Other abnormal and inconclusive findings on diagnostic imaging of breast: Secondary | ICD-10-CM

## 2020-10-10 DIAGNOSIS — N6489 Other specified disorders of breast: Secondary | ICD-10-CM

## 2020-10-21 ENCOUNTER — Ambulatory Visit
Admission: RE | Admit: 2020-10-21 | Discharge: 2020-10-21 | Disposition: A | Discharge status: Home / Self Care | Payer: BC Managed Care – PPO | Source: Ambulatory Visit | Attending: Obstetrics & Gynecology | Admitting: Obstetrics & Gynecology

## 2020-10-21 ENCOUNTER — Other Ambulatory Visit: Payer: Self-pay

## 2020-10-21 DIAGNOSIS — R928 Other abnormal and inconclusive findings on diagnostic imaging of breast: Secondary | ICD-10-CM

## 2020-10-21 DIAGNOSIS — N6489 Other specified disorders of breast: Secondary | ICD-10-CM

## 2020-11-15 ENCOUNTER — Encounter: Payer: Self-pay | Admitting: Emergency Medicine

## 2020-11-15 ENCOUNTER — Other Ambulatory Visit: Payer: Self-pay

## 2020-11-15 ENCOUNTER — Emergency Department
Admission: EM | Admit: 2020-11-15 | Discharge: 2020-11-15 | Disposition: A | Discharge status: Home / Self Care | Payer: BC Managed Care – PPO | Attending: Emergency Medicine | Admitting: Emergency Medicine

## 2020-11-15 DIAGNOSIS — I471 Supraventricular tachycardia: Secondary | ICD-10-CM | POA: Insufficient documentation

## 2020-11-15 DIAGNOSIS — Z87891 Personal history of nicotine dependence: Secondary | ICD-10-CM | POA: Insufficient documentation

## 2020-11-15 DIAGNOSIS — R002 Palpitations: Secondary | ICD-10-CM | POA: Diagnosis present

## 2020-11-15 LAB — CBC WITH DIFFERENTIAL/PLATELET
Abs Immature Granulocytes: 0.03 10*3/uL (ref 0.00–0.07)
Basophils Absolute: 0 10*3/uL (ref 0.0–0.1)
Basophils Relative: 0 %
Eosinophils Absolute: 0.1 10*3/uL (ref 0.0–0.5)
Eosinophils Relative: 1 %
HCT: 37.9 % (ref 36.0–46.0)
Hemoglobin: 12.8 g/dL (ref 12.0–15.0)
Immature Granulocytes: 0 %
Lymphocytes Relative: 17 %
Lymphs Abs: 1.3 10*3/uL (ref 0.7–4.0)
MCH: 31.6 pg (ref 26.0–34.0)
MCHC: 33.8 g/dL (ref 30.0–36.0)
MCV: 93.6 fL (ref 80.0–100.0)
Monocytes Absolute: 0.5 10*3/uL (ref 0.1–1.0)
Monocytes Relative: 6 %
Neutro Abs: 5.7 10*3/uL (ref 1.7–7.7)
Neutrophils Relative %: 76 %
Platelets: 213 10*3/uL (ref 150–400)
RBC: 4.05 MIL/uL (ref 3.87–5.11)
RDW: 12.1 % (ref 11.5–15.5)
WBC: 7.6 10*3/uL (ref 4.0–10.5)
nRBC: 0 % (ref 0.0–0.2)

## 2020-11-15 LAB — TSH: TSH: 0.663 u[IU]/mL (ref 0.350–4.500)

## 2020-11-15 LAB — BASIC METABOLIC PANEL
Anion gap: 7 (ref 5–15)
BUN: 11 mg/dL (ref 6–20)
CO2: 27 mmol/L (ref 22–32)
Calcium: 8.9 mg/dL (ref 8.9–10.3)
Chloride: 106 mmol/L (ref 98–111)
Creatinine, Ser: 0.68 mg/dL (ref 0.44–1.00)
GFR, Estimated: >60 mL/min (ref >60)
Glucose, Bld: 97 mg/dL (ref 70–99)
Potassium: 4.1 mmol/L (ref 3.5–5.1)
Sodium: 140 mmol/L (ref 135–145)

## 2020-11-15 LAB — MAGNESIUM: Magnesium: 2.5 mg/dL — ABNORMAL HIGH (ref 1.7–2.4)

## 2020-11-15 MED ORDER — METOPROLOL TARTRATE 25 MG PO TABS
12.5000 mg | ORAL_TABLET | Freq: Once | ORAL | Status: AC
  Administered 2020-11-15: 12.5 mg via ORAL
  Filled 2020-11-15: qty 1

## 2020-11-15 MED ORDER — METOPROLOL TARTRATE 25 MG PO TABS: 12.5000 mg | ORAL_TABLET | Freq: Two times a day (BID) | ORAL | 0 refills | Status: DC

## 2020-11-15 MED ORDER — METOPROLOL TARTRATE 25 MG PO TABS: 12.5000 mg | ORAL_TABLET | Freq: Two times a day (BID) | ORAL | 1 refills | Status: DC

## 2020-11-15 NOTE — Discharge Instructions (Addendum)
Try to minimize caffeine, get plenty of sleep, and drink at least 6 to 8 glasses of water daily.  Avoid any energy or weight loss supplements.  If you begin to feel symptoms again, try to bear down as if you are having a bowel movement.  This is called a vagal maneuver.  Take the metoprolol.  It is okay to start this tonight or tomorrow morning.

## 2020-11-15 NOTE — ED Provider Notes (Signed)
[facility] Emergency Department Provider Note  ____________________________________________   First MD Initiated Contact with Patient 11/15/20 [phone]     (approximate)  I have reviewed the triage vital signs and the nursing notes.   HISTORY  Chief Complaint Tachycardia    HPI Olivia Patterson is a 44 y.o. female here with palpitations.  The patient states that she was in her usual state of health going to class this morning.  Patient states that he experienced acute onset of severe palpitations and sensation like her heart was beating extremely quickly.  She states that she had some associated shortness of breath.  She called EMS.  She states she has had similar symptoms in the past, but these resolved spontaneously and she did not seek medical care.  With EMS, she was noted to be in what appeared to be supraventricular tachycardia.  She underwent vagal maneuvers followed by adenosine 6 mg with spontaneous cardioversion.  The patient states she feels much better now.  No chest pain.  No shortness of breath.  No other acute complaints.  No recent fevers or chills.  She does note increased stress recently, as well as regular fairly heavy caffeine use.        Past Medical History:  Diagnosis Date  . Herpes   . LGSIL on Pap smear of cervix     Patient Active Problem List   Diagnosis Date Noted  . History of cervical dysplasia 06/04/2017    Past Surgical History:  Procedure Laterality Date  . BARIATRIC SURGERY    . WISDOM TOOTH EXTRACTION      Prior to Admission medications   Medication Sig Start Date End Date Taking? Authorizing Provider  cyclobenzaprine (FLEXERIL) 10 MG tablet Take 1 tablet (10 mg total) by mouth 3 (three) times daily as needed. Patient not taking: Reported on 06/23/2020 03/19/20   Joni Reining, PA-C  levonorgestrel (LILETTA, 52 MG,) 18.6 MCG/DAY IUD IUD 1 each by Intrauterine route once.    [provider]    methylPREDNISolone (MEDROL DOSEPAK) 4 MG TBPK tablet Take Tapered dose as directed 03/19/20   Joni Reining, PA-C  metoprolol tartrate (LOPRESSOR) 25 MG tablet Take 0.5 tablets (12.5 mg total) by mouth 2 (two) times daily. 11/15/20 01/14/21  Shaune Pollack, MD  oxyCODONE-acetaminophen (PERCOCET) 7.5-325 MG tablet Take 1 tablet by mouth every 6 (six) hours as needed. Patient not taking: Reported on 06/23/2020 03/19/20   Joni Reining, PA-C  valACYclovir (VALTREX) 500 MG tablet TAKE 1 TABLET(500 MG) BY MOUTH TWICE DAILY FOR 3 DAYS 04/04/20   Nadara Mustard, MD    Allergies Patient has no known allergies.  Family History  Problem Relation Age of Onset  . Breast cancer Mother 32  . Diabetes Father     Social History Social History   Tobacco Use  . Smoking status: Former Games developer  . Smokeless tobacco: Never Used  Vaping Use  . Vaping Use: Never used  Substance Use Topics  . Alcohol use: No  . Drug use: No    Review of Systems  Review of Systems  Constitutional: Positive for fatigue. Negative for chills and fever.  HENT: Negative for sore throat.   Respiratory: Positive for shortness of breath.   Cardiovascular: Positive for palpitations. Negative for chest pain.  Gastrointestinal: Negative for abdominal pain.  Genitourinary: Negative for flank pain.  Musculoskeletal: Negative for neck pain.  Skin: Negative for rash and wound.  Allergic/Immunologic: Negative for immunocompromised state.  Neurological:  Negative for weakness and numbness.  Hematological: Does not bruise/bleed easily.  All other systems reviewed and are negative.    ____________________________________________  PHYSICAL EXAM:      VITAL SIGNS: ED Triage Vitals  Enc Vitals Group     BP 11/15/20 1006 (!) 128/94     Pulse Rate 11/15/20 1006 84     Resp 11/15/20 1006 16     Temp 11/15/20 1006 98.4 F (36.9 C)     Temp Source 11/15/20 1006 Oral     SpO2 11/15/20 1006 100 %     Weight --      Height --       Head Circumference --      Peak Flow --      Pain Score 11/15/20 1007 0     Pain Loc --      Pain Edu? --      Excl. in GC? --      Physical Exam Vitals and nursing note reviewed.  Constitutional:      General: She is not in acute distress.    Appearance: She is well-developed.  HENT:     Head: Normocephalic and atraumatic.  Eyes:     Conjunctiva/sclera: Conjunctivae normal.  Cardiovascular:     Rate and Rhythm: Normal rate and regular rhythm.     Heart sounds: Normal heart sounds. No murmur heard.  No friction rub.  Pulmonary:     Effort: Pulmonary effort is normal. No respiratory distress.     Breath sounds: Normal breath sounds. No wheezing or rales.  Abdominal:     General: There is no distension.     Palpations: Abdomen is soft.     Tenderness: There is no abdominal tenderness.  Musculoskeletal:     Cervical back: Neck supple.  Skin:    General: Skin is warm.     Capillary Refill: Capillary refill takes less than 2 seconds.     Findings: No rash.  Neurological:     Mental Status: She is alert and oriented to person, place, and time.     Motor: No abnormal muscle tone.       ____________________________________________   LABS (all labs ordered are listed, but only abnormal results are displayed)  Labs Reviewed  MAGNESIUM - Abnormal; Notable for the following components:      Result Value   Magnesium 2.5 (*)    All other components within normal limits  CBC WITH DIFFERENTIAL/PLATELET  TSH  BASIC METABOLIC PANEL    ____________________________________________  EKG: Sinus rhythm, ventricular rate 86.  QRS 104, QTc 453.  No acute ST elevations or depressions.  No WPW or HOCM. ________________________________________  RADIOLOGY All imaging, including plain films, CT scans, and ultrasounds, independently reviewed by me, and interpretations confirmed via formal radiology reads.  ED MD interpretation:     Official radiology report(s): No results  found.  ____________________________________________  PROCEDURES   Procedure(s) performed (including Critical Care):  Procedures  ____________________________________________  INITIAL IMPRESSION / MDM / ASSESSMENT AND PLAN / ED COURSE  As part of my medical decision making, I reviewed the following data within the electronic MEDICAL RECORD NUMBER Nursing notes reviewed and incorporated, Old chart reviewed, Notes from prior ED visits, and [place] Controlled Substance Database       *Olivia Patterson was evaluated in Emergency Department on 11/15/2020 for the symptoms described in the history of present illness. She was evaluated in the context of the global COVID-19 pandemic, which necessitated consideration that the patient  might be at risk for infection with the SARS-CoV-2 virus that causes COVID-19. Institutional protocols and algorithms that pertain to the evaluation of patients at risk for COVID-19 are in a state of rapid change based on information released by regulatory bodies including the CDC and federal and state organizations. These policies and algorithms were followed during the patient's care in the ED.  Some ED evaluations and interventions may be delayed as a result of limited staffing during the pandemic.*     Medical Decision Making: 44 year old female here with SVT, now resolved after adenosine.  Historically, it seems she may have had SVT previously that resolved.  On arrival here, she remains in normal sinus rhythm.  EKG shows sinus rhythm without evidence of WPW or other arrhythmogenic abnormality.  She was monitored on cardiac telemetry which was reviewed by me and shows normal sinus rhythm without ectopy.  CBC without significant anemia.  Electrolytes within normal limits.  She has no chest pain or signs of ACS.  No lower extremity swelling or signs of DVT/PE.  Discussed the case with Dr. Juliann Pares.  Given that she has had similar symptoms in the past, will place her on a very  low dose of metoprolol and have her follow-up in clinic.  Return precautions given.  She is counseled on symptoms to watch out for while taking metoprolol including hypotension, drowsiness, lightheadedness.  ____________________________________________  FINAL CLINICAL IMPRESSION(S) / ED DIAGNOSES  Final diagnoses:  SVT (supraventricular tachycardia) (HCC)     MEDICATIONS GIVEN DURING THIS VISIT:  Medications  metoprolol tartrate (LOPRESSOR) tablet 12.5 mg (12.5 mg Oral Given 11/15/20 1058)        Note:  This document was prepared using Dragon voice recognition software and may include unintentional dictation errors.   Shaune Pollack, MD 11/15/20 [phone]

## 2020-11-15 NOTE — ED Triage Notes (Addendum)
Pt here from home via EMS with c/o SVT that started about 30 min ago, has had this happen one other time, so she knew to attempt vagal maneuver at home, however, did not convert at that time. Pt converted en route to NSR after 6mg  Adenosine given, states she is in no pain, however is scared. Oriented to room and call bell. NAD. NSR on monitor at this time.

## 2021-07-13 ENCOUNTER — Encounter: Payer: Self-pay | Admitting: Obstetrics & Gynecology

## 2021-07-13 ENCOUNTER — Other Ambulatory Visit (HOSPITAL_COMMUNITY)
Admission: RE | Admit: 2021-07-13 | Discharge: 2021-07-13 | Disposition: A | Discharge status: Home / Self Care | Payer: BC Managed Care – PPO | Source: Ambulatory Visit | Attending: Obstetrics & Gynecology | Admitting: Obstetrics & Gynecology

## 2021-07-13 ENCOUNTER — Ambulatory Visit (INDEPENDENT_AMBULATORY_CARE_PROVIDER_SITE_OTHER): Payer: BC Managed Care – PPO | Admitting: Obstetrics & Gynecology

## 2021-07-13 ENCOUNTER — Other Ambulatory Visit: Payer: Self-pay

## 2021-07-13 VITALS — BP 100/60 | Ht 67.0 in | Wt 190.0 lb

## 2021-07-13 DIAGNOSIS — Z1321 Encounter for screening for nutritional disorder: Secondary | ICD-10-CM

## 2021-07-13 DIAGNOSIS — Z1231 Encounter for screening mammogram for malignant neoplasm of breast: Secondary | ICD-10-CM | POA: Diagnosis not present

## 2021-07-13 DIAGNOSIS — E7800 Pure hypercholesterolemia, unspecified: Secondary | ICD-10-CM

## 2021-07-13 DIAGNOSIS — Z87410 Personal history of cervical dysplasia: Secondary | ICD-10-CM | POA: Insufficient documentation

## 2021-07-13 DIAGNOSIS — Z01419 Encounter for gynecological examination (general) (routine) without abnormal findings: Secondary | ICD-10-CM | POA: Diagnosis not present

## 2021-07-13 DIAGNOSIS — Z1329 Encounter for screening for other suspected endocrine disorder: Secondary | ICD-10-CM

## 2021-07-13 DIAGNOSIS — Z131 Encounter for screening for diabetes mellitus: Secondary | ICD-10-CM

## 2021-07-13 NOTE — Patient Instructions (Signed)
PAP every three years if normal Mammogram every year, this Oct-Nov    Call [phone] to schedule at [facility] Colonoscopy every 10 years (consider) Labs soon  Thank you for choosing [facility]. As part of our ongoing efforts to improve patient experience, we would appreciate your feedback. Please fill out the short survey that you will receive by mail or MyChart. Your opinion is important to [place]! - Dr. Tiburcio Pea

## 2021-07-13 NOTE — Progress Notes (Signed)
HPI:      Ms. Olivia Patterson is a 45 y.o. G2P0020 who LMP was Patient's last menstrual period was 07/01/2021., she presents today for her annual examination. The patient has no complaints today. The patient is sexually active. Her last pap: approximate date 2021 and was normal and prior abnormality 2017 being followed closely; and last mammogram: approximate date 2021 and was normal. The patient does perform self breast exams.  There is notable family history of breast or ovarian cancer in her family.  The patient has regular exercise: yes.  The patient denies current symptoms of depression.    GYN History: Contraception: IUD Liletta year 5-6  PMHx: Past Medical History:  Diagnosis Date   Herpes    LGSIL on Pap smear of cervix    SVT (supraventricular tachycardia) (HCC)    Past Surgical History:  Procedure Laterality Date   BARIATRIC SURGERY     WISDOM TOOTH EXTRACTION     Family History  Problem Relation Age of Onset   Breast cancer Mother 75   Diabetes Father    Social History   Tobacco Use   Smoking status: Former   Smokeless tobacco: Never  Building services engineer Use: Never used  Substance Use Topics   Alcohol use: No   Drug use: No    Current Outpatient Medications:    levonorgestrel (LILETTA) 18.6 MCG/DAY IUD IUD, 1 each by Intrauterine route once., Disp: , Rfl:    metoprolol tartrate (LOPRESSOR) 25 MG tablet, Take by mouth., Disp: , Rfl:    valACYclovir (VALTREX) 500 MG tablet, TAKE 1 TABLET(500 MG) BY MOUTH TWICE DAILY FOR 3 DAYS, Disp: 6 tablet, Rfl: 6   metoprolol tartrate (LOPRESSOR) 25 MG tablet, Take 0.5 tablets (12.5 mg total) by mouth 2 (two) times daily., Disp: 60 tablet, Rfl: 0 Allergies: Patient has no known allergies.  Review of Systems  Constitutional:  Negative for chills, fever and malaise/fatigue.  HENT:  Negative for congestion, sinus pain and sore throat.   Eyes:  Negative for blurred vision and pain.  Respiratory:  Negative for cough and  wheezing.   Cardiovascular:  Negative for chest pain and leg swelling.  Gastrointestinal:  Negative for abdominal pain, constipation, diarrhea, heartburn, nausea and vomiting.  Genitourinary:  Negative for dysuria, frequency, hematuria and urgency.  Musculoskeletal:  Negative for back pain, joint pain, myalgias and neck pain.  Skin:  Negative for itching and rash.  Neurological:  Negative for dizziness, tremors and weakness.  Endo/Heme/Allergies:  Does not bruise/bleed easily.  Psychiatric/Behavioral:  Negative for depression. The patient is not nervous/anxious and does not have insomnia.    Objective: BP 100/60   Ht 5\' 7"  (1.702 m)   Wt 190 lb (86.2 kg)   LMP 07/01/2021   BMI 29.76 kg/m   Filed Weights   07/13/21 1050  Weight: 190 lb (86.2 kg)   Body mass index is 29.76 kg/m. Physical Exam Constitutional:      General: She is not in acute distress.    Appearance: She is well-developed.  Genitourinary:     Bladder, rectum and urethral meatus normal.     No lesions in the vagina.     Right Labia: No rash, tenderness or lesions.    Left Labia: No tenderness, lesions or rash.    No vaginal bleeding.      Right Adnexa: not tender and no mass present.    Left Adnexa: not tender and no mass present.    No cervical  motion tenderness, friability, lesion or polyp.     IUD strings visualized.     Uterus is not enlarged.     No uterine mass detected.    Uterus is anteverted.     Pelvic exam was performed with patient in the lithotomy position.  Breasts:    Right: No mass, skin change or tenderness.     Left: No mass, skin change or tenderness.  HENT:     Head: Normocephalic and atraumatic. No laceration.     Right Ear: Hearing normal.     Left Ear: Hearing normal.     Mouth/Throat:     Pharynx: Uvula midline.  Eyes:     Pupils: Pupils are equal, round, and reactive to light.  Neck:     Thyroid: No thyromegaly.  Cardiovascular:     Rate and Rhythm: Normal rate and regular  rhythm.     Heart sounds: No murmur heard.   No friction rub. No gallop.  Pulmonary:     Effort: Pulmonary effort is normal. No respiratory distress.     Breath sounds: Normal breath sounds. No wheezing.  Abdominal:     General: Bowel sounds are normal. There is no distension.     Palpations: Abdomen is soft.     Tenderness: There is no abdominal tenderness. There is no rebound.  Musculoskeletal:        General: Normal range of motion.     Cervical back: Normal range of motion and neck supple.  Neurological:     Mental Status: She is alert and oriented to person, place, and time.     Cranial Nerves: No cranial nerve deficit.  Skin:    General: Skin is warm and dry.  Psychiatric:        Judgment: Judgment normal.  Vitals reviewed.    Assessment:  ANNUAL EXAM 1. Women's annual routine gynecological examination   2. Encounter for screening mammogram for malignant neoplasm of breast   3. History of cervical dysplasia   4. Hypercholesterolemia   5. Encounter for vitamin deficiency screening   6. Screening for diabetes mellitus   7. Screening for thyroid disorder      Screening Plan:            1.  Cervical Screening-  Pap smear done today  2. Breast screening- Exam annually and mammogram>40 planned   3. Colonoscopy every 10 years, Hemoccult testing - after age 74  4. Labs To return fasting at a later date  5. Counseling for contraception: IUD IUD exchange next year Sooner if BTB occurs     F/U  Return in about 1 year (around 07/13/2022) for Annual, also schedule fasting lab appt soon.  [name], MD, [facility], [facility] 07/13/2021  11:31 AM

## 2021-07-14 ENCOUNTER — Other Ambulatory Visit: Payer: BC Managed Care – PPO

## 2021-07-14 DIAGNOSIS — Z131 Encounter for screening for diabetes mellitus: Secondary | ICD-10-CM

## 2021-07-14 DIAGNOSIS — Z1329 Encounter for screening for other suspected endocrine disorder: Secondary | ICD-10-CM

## 2021-07-14 DIAGNOSIS — E7800 Pure hypercholesterolemia, unspecified: Secondary | ICD-10-CM

## 2021-07-14 DIAGNOSIS — Z1321 Encounter for screening for nutritional disorder: Secondary | ICD-10-CM

## 2021-07-14 LAB — CYTOLOGY - PAP
Comment: NEGATIVE
Diagnosis: NEGATIVE
High risk HPV: NEGATIVE

## 2021-07-15 LAB — LIPID PANEL
Chol/HDL Ratio: 3.1 ratio (ref 0.0–4.4)
Cholesterol, Total: 175 mg/dL (ref 100–199)
HDL: 56 mg/dL (ref >39)
LDL Chol Calc (NIH): 105 mg/dL — ABNORMAL HIGH (ref 0–99)
Triglycerides: 75 mg/dL (ref 0–149)
VLDL Cholesterol Cal: 14 mg/dL (ref 5–40)

## 2021-07-15 LAB — VITAMIN D 25 HYDROXY (VIT D DEFICIENCY, FRACTURES): Vit D, 25-Hydroxy: 48.4 ng/mL (ref 30.0–100.0)

## 2021-07-15 LAB — TSH: TSH: 0.988 u[IU]/mL (ref 0.450–4.500)

## 2021-07-15 LAB — GLUCOSE, FASTING: Glucose, Plasma: 84 mg/dL (ref 65–99)

## 2021-10-10 ENCOUNTER — Ambulatory Visit
Admission: RE | Admit: 2021-10-10 | Discharge: 2021-10-10 | Disposition: A | Discharge status: Home / Self Care | Payer: BC Managed Care – PPO | Source: Ambulatory Visit | Attending: Obstetrics & Gynecology | Admitting: Obstetrics & Gynecology

## 2021-10-10 ENCOUNTER — Other Ambulatory Visit: Payer: Self-pay

## 2021-10-10 DIAGNOSIS — Z1231 Encounter for screening mammogram for malignant neoplasm of breast: Secondary | ICD-10-CM | POA: Insufficient documentation

## 2021-12-31 IMAGING — MG MM DIGITAL SCREENING BILAT W/ TOMO AND CAD
8 series · 8 of 24 positions shown · non-contrast
Comparison: Previous exam(s).

CLINICAL DATA: Screening.

EXAM:
DIGITAL SCREENING BILATERAL MAMMOGRAM WITH TOMOSYNTHESIS AND CAD
TECHNIQUE: Bilateral screening digital craniocaudal and mediolateral oblique
mammograms were obtained. Bilateral screening digital breast
tomosynthesis was performed. The images were evaluated with
computer-aided detection.

[R CC synth-2D]
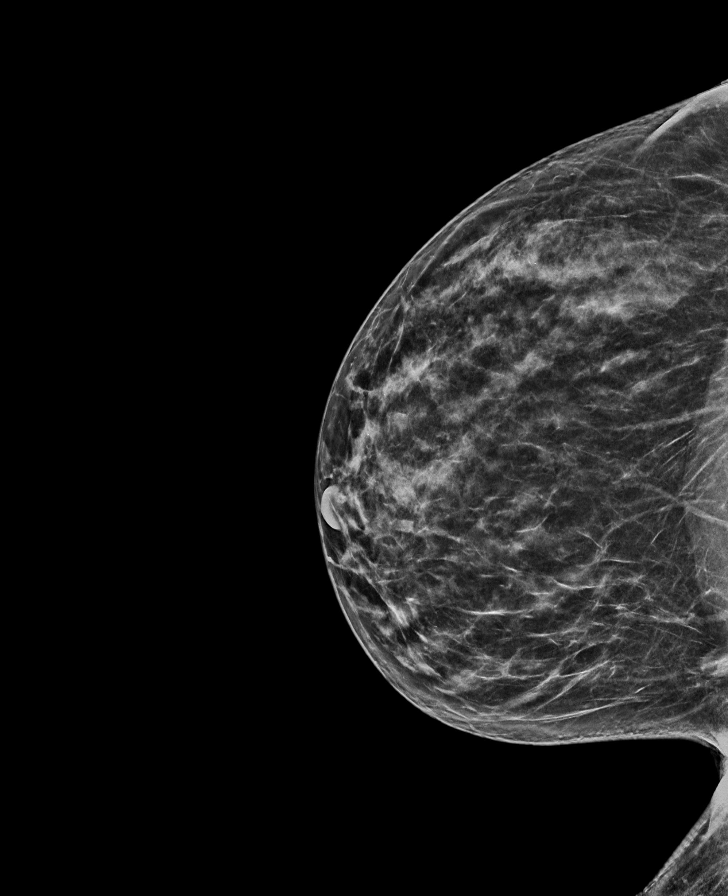

[L CC synth-2D]
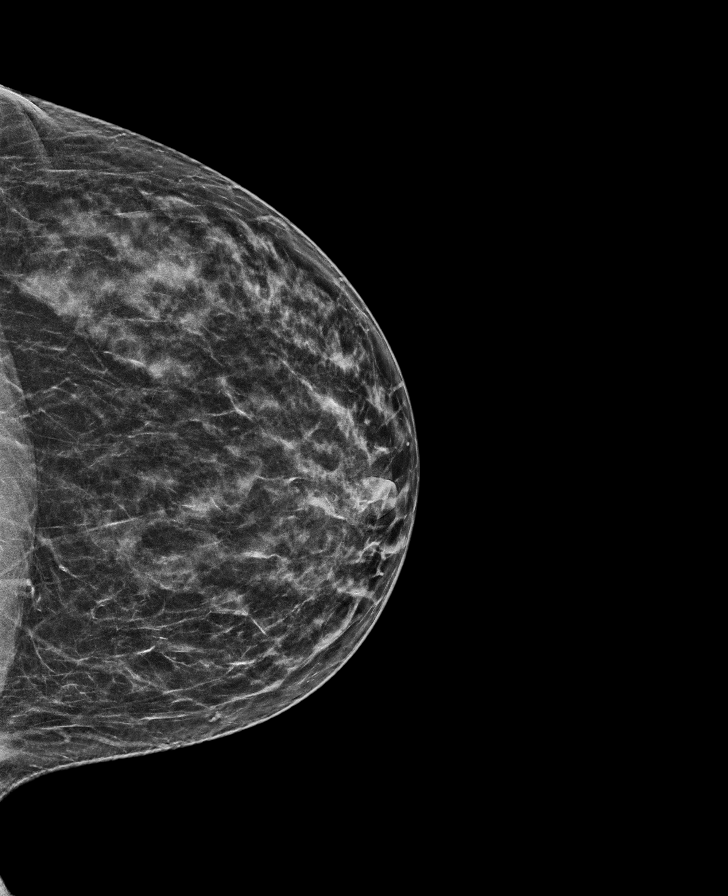

[L MLO synth-2D]
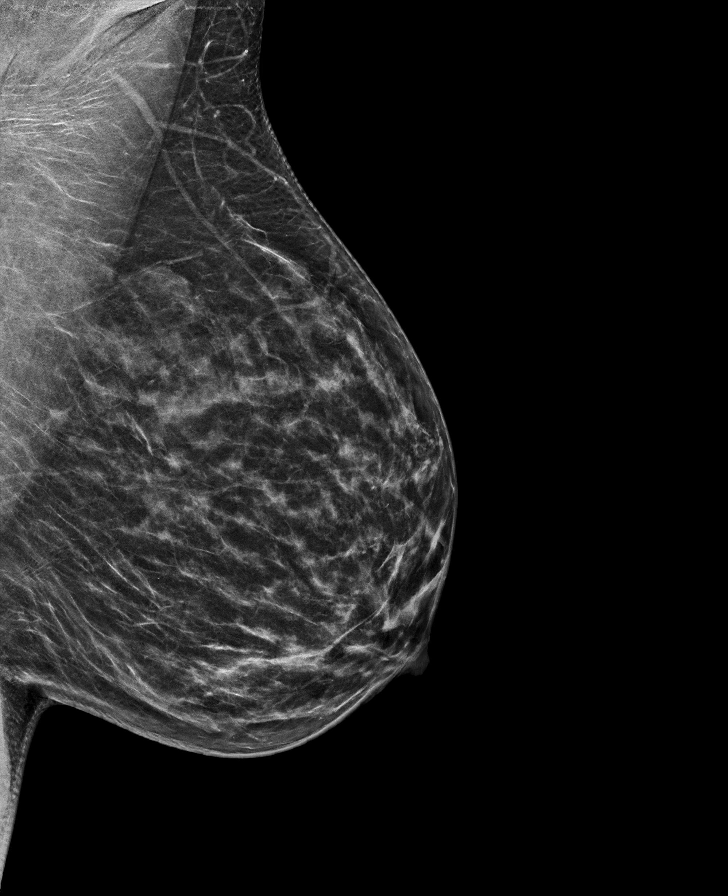

[R MLO synth-2D]
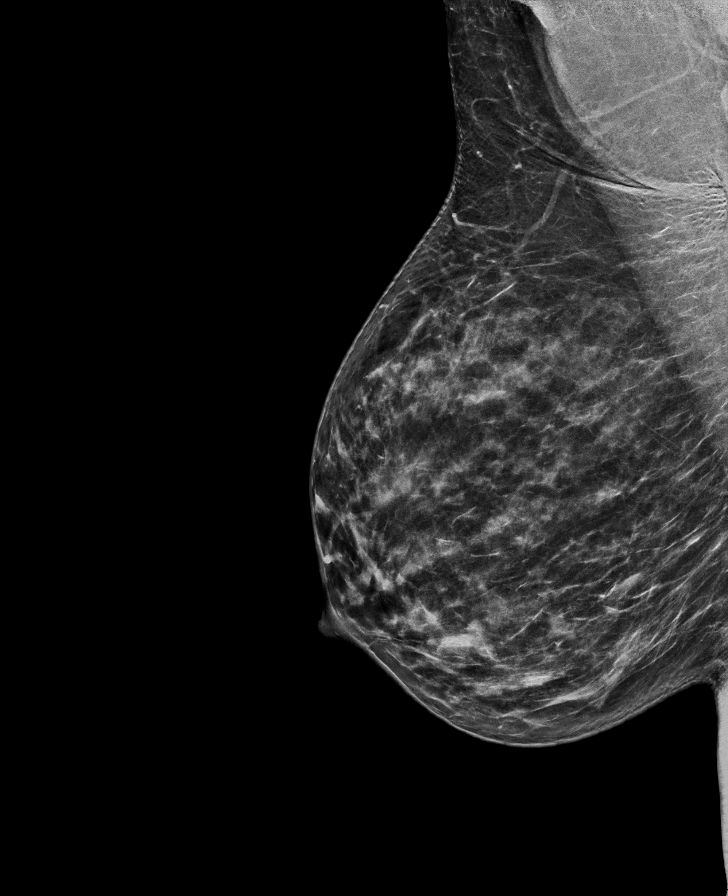

[L CC tomo · tomo slice 30/59.0]
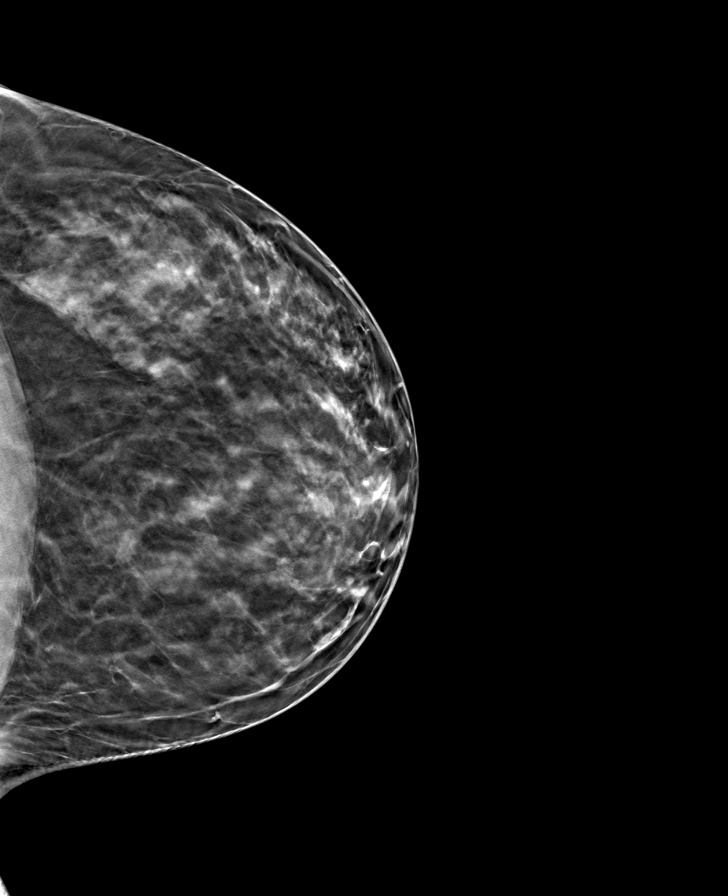

[L MLO tomo · tomo slice 31/61.0]
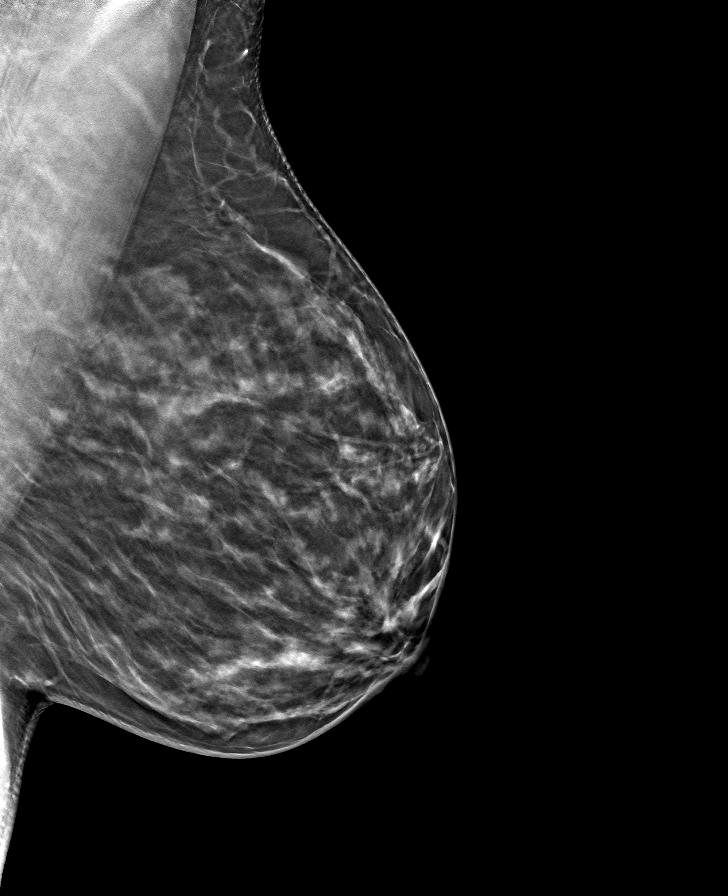

[R CC tomo · tomo slice 31/62.0]
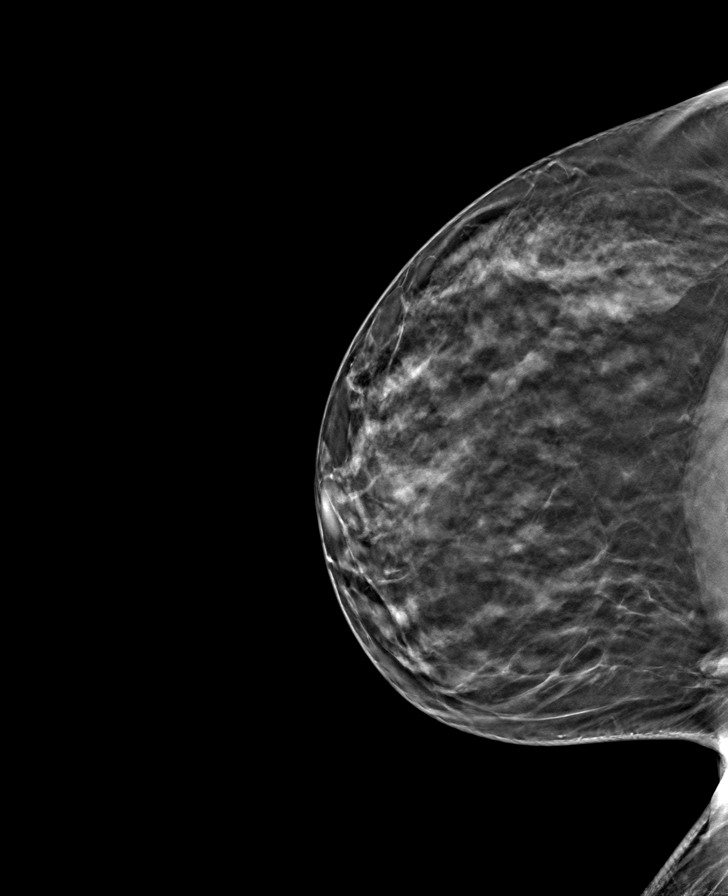

[R MLO tomo · tomo slice 33/65.0]
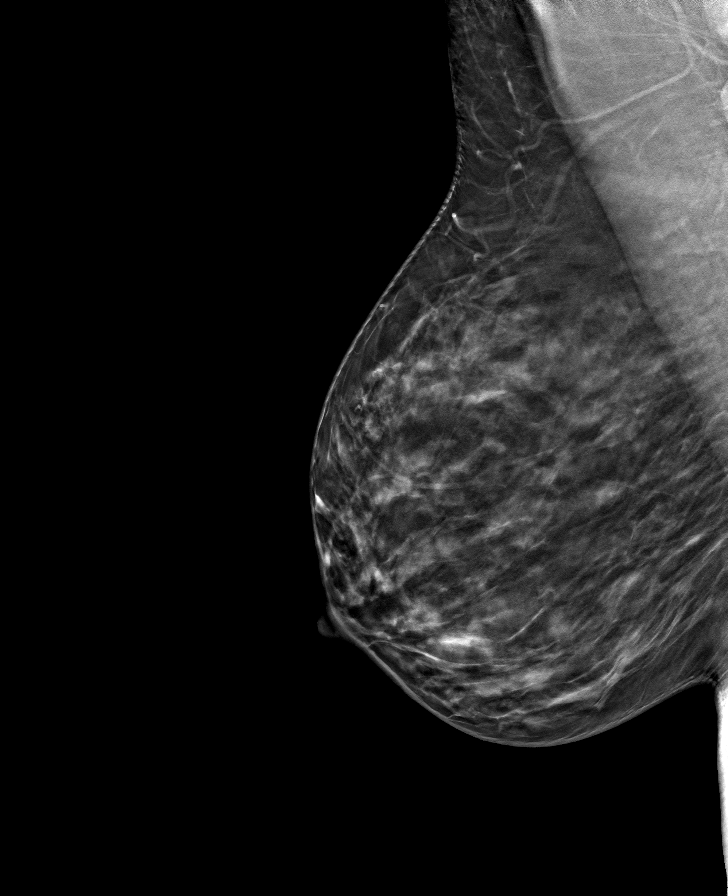

[8 of 24 positions shown; findings below may reference images not displayed]

ACR Breast Density Category c: The breast tissue is heterogeneously
dense, which may obscure small masses.
FINDINGS: There are no findings suspicious for malignancy.
IMPRESSION: No mammographic evidence of malignancy. A result letter of this
screening mammogram will be mailed directly to the patient.

RECOMMENDATION:
Screening mammogram in one year. (Code:Q3-W-BC3)

BI-RADS CATEGORY  1: Negative.

## 2022-05-29 ENCOUNTER — Telehealth: Payer: Self-pay

## 2022-05-29 NOTE — Telephone Encounter (Signed)
Noted. Liletta placed 01/30/2016 per records reviewed in [place]. Penni Bombard is FDA approved for 8 years as if 07/2021. Exchange not due til 01/30/2024.

## 2022-05-29 NOTE — Telephone Encounter (Signed)
Patient is scheduled for 07/24/22 with JEG for Annual and lyletta replacement at 8:35 am.

## 2022-07-24 ENCOUNTER — Encounter: Payer: Self-pay | Admitting: Advanced Practice Midwife

## 2022-07-24 ENCOUNTER — Ambulatory Visit (INDEPENDENT_AMBULATORY_CARE_PROVIDER_SITE_OTHER): Payer: BC Managed Care – PPO | Admitting: Advanced Practice Midwife

## 2022-07-24 VITALS — BP 120/80 | Ht 67.0 in | Wt 188.0 lb

## 2022-07-24 DIAGNOSIS — Z30433 Encounter for removal and reinsertion of intrauterine contraceptive device: Secondary | ICD-10-CM | POA: Diagnosis not present

## 2022-07-24 DIAGNOSIS — B001 Herpesviral vesicular dermatitis: Secondary | ICD-10-CM | POA: Diagnosis not present

## 2022-07-24 DIAGNOSIS — Z131 Encounter for screening for diabetes mellitus: Secondary | ICD-10-CM

## 2022-07-24 DIAGNOSIS — Z01411 Encounter for gynecological examination (general) (routine) with abnormal findings: Secondary | ICD-10-CM

## 2022-07-24 DIAGNOSIS — Z13220 Encounter for screening for lipoid disorders: Secondary | ICD-10-CM

## 2022-07-24 DIAGNOSIS — Z01419 Encounter for gynecological examination (general) (routine) without abnormal findings: Secondary | ICD-10-CM

## 2022-07-24 DIAGNOSIS — Z1239 Encounter for other screening for malignant neoplasm of breast: Secondary | ICD-10-CM

## 2022-07-24 MED ORDER — VALACYCLOVIR HCL 500 MG PO TABS: ORAL_TABLET | ORAL | 6 refills | Status: DC

## 2022-07-24 MED ORDER — LEVONORGESTREL 20.1 MCG/DAY IU IUD
1.0000 | INTRAUTERINE_SYSTEM | Freq: Once | INTRAUTERINE | Status: AC
  Administered 2022-07-24: 1 via INTRAUTERINE

## 2022-07-24 NOTE — Progress Notes (Addendum)
    GYNECOLOGY OFFICE PROCEDURE NOTE  AUDREENA SACHDEVA is a 46 y.o. G2P1011 here for IUD removal and reinsertion. The patient currently has a Liletta IUD placed about 8 years ago which will be replaced with a Liletta IUD today.  No GYN concerns.  Last pap smear was on 07/13/2021 and was normal.  IUD Removal and Reinsertion  Patient identified, informed consent performed, consent signed.   Discussed risks of irregular bleeding, cramping, infection, malpositioning or uterine perforation of the IUD which may require further procedures. Time out was performed. Speculum placed in the vagina. The strings of the IUD were grasped and pulled using curved forceps. The IUD was successfully removed in its entirety. The cervix was cleaned with Betadine x 2 and grasped anteriorly with a single tooth tenaculum.  The uterus was sounded to 6.5 cm using a uterine sound.  The IUD was then placed per manufacturer's recommendations. Strings trimmed to 3 cm. Tenaculum was removed, good hemostasis noted. Patient tolerated procedure well.   Patient was given post-procedure instructions.  Patient was also asked to check IUD strings periodically and follow up in 4-6 weeks for IUD check.   Tresea Mall, CNM [facility] 07/24/2022, 10:01 AM  IUD insertion CPT 58300,  Skyla [id number] Mirena [id number] Liletta [id number] Paraguard [id number] Rutha Bouchard [id number] IUD remval 26712 Modifer 25, plus Modifer 79 is done during a global billing visit

## 2022-07-24 NOTE — Progress Notes (Addendum)
Gynecology Annual Exam  PCP: [facility]  Chief Complaint:  Chief Complaint  Patient presents with   Annual Exam   Contraception    Iud    History of Present Illness: Patient is a 46 y.o. G2P1011 presents for annual exam. The patient has no gyn complaints today. She mentions ongoing constipation. Reviewed healthy bowel routine. She had a normal colonoscopy in March of this year.  LMP: No LMP recorded. (Menstrual status: IUD). Average Interval: regular, 28 days Duration of flow:  3-4  days Heavy Menses: very light Clots: no Intermenstrual Bleeding: no Postcoital Bleeding: no Dysmenorrhea: no   The patient is sexually active. She currently uses IUD for contraception. She denies dyspareunia.  The patient does perform self breast exams.  There is no notable family history of breast or ovarian cancer in her family.  The patient wears seatbelts: yes.   The patient has regular exercise:  she walks her dog regularly, she admits generally healthy diet and needs to drink more water, she usually has 6 hours of sleep .    The patient denies current symptoms of depression.    Review of Systems: Review of Systems  Constitutional:  Positive for malaise/fatigue. Negative for chills and fever.  HENT:  Negative for congestion, ear discharge, ear pain, hearing loss, sinus pain and sore throat.   Eyes:  Negative for blurred vision and double vision.  Respiratory:  Negative for cough, shortness of breath and wheezing.   Cardiovascular:  Negative for chest pain, palpitations and leg swelling.  Gastrointestinal:  Negative for abdominal pain, blood in stool, constipation, diarrhea, heartburn, melena, nausea and vomiting.  Genitourinary:  Negative for dysuria, flank pain, frequency, hematuria and urgency.  Musculoskeletal:  Negative for back pain, joint pain and myalgias.  Skin:  Negative for itching and rash.  Neurological:  Negative for dizziness, tingling, tremors, sensory  change, speech change, focal weakness, seizures, loss of consciousness, weakness and headaches.  Endo/Heme/Allergies:  Negative for environmental allergies. Does not bruise/bleed easily.  Psychiatric/Behavioral:  Negative for depression, hallucinations, memory loss, substance abuse and suicidal ideas. The patient is not nervous/anxious and does not have insomnia.     Past Medical History:  Patient Active Problem List   Diagnosis Date Noted   Fatigue due to sleep pattern disturbance 12/23/2019   Herpes labialis 12/23/2019   History of cervical dysplasia 06/04/2017    Past Surgical History:  Past Surgical History:  Procedure Laterality Date   BARIATRIC SURGERY     WISDOM TOOTH EXTRACTION      Gynecologic History:  No LMP recorded. (Menstrual status: IUD). Contraception: IUD Last Pap: 2022 Results were:  no abnormalities  Last mammogram: 1 year ago Results were: BI-RAD I  Obstetric History: G69P1011  Family History:  Family History  Problem Relation Age of Onset   Breast cancer Mother 77   Diabetes Father     Social History:  Social History   Socioeconomic History   Marital status: Married    Spouse name: Not on file   Number of children: Not on file   Years of education: Not on file   Highest education level: Not on file  Occupational History   Not on file  Tobacco Use   Smoking status: Former   Smokeless tobacco: Never  Vaping Use   Vaping Use: Never used  Substance and Sexual Activity   Alcohol use: No   Drug use: No   Sexual activity: Yes    Birth control/protection: I.U.D.  Other Topics Concern   Not on file  Social History Narrative   Not on file   Social Determinants of Health   Financial Resource Strain: Not on file  Food Insecurity: Not on file  Transportation Needs: Not on file  Physical Activity: Not on file  Stress: Not on file  Social Connections: Not on file  Intimate Partner Violence: Not on file    Allergies:  No Known  Allergies  Medications: Prior to Admission medications   Medication Sig Start Date End Date Taking? Authorizing Provider  levonorgestrel (LILETTA) 18.6 MCG/DAY IUD IUD 1 each by Intrauterine route once.   Yes [provider]  metoprolol tartrate (LOPRESSOR) 25 MG tablet Take by mouth. 01/23/21  Yes [provider]  valACYclovir (VALTREX) 500 MG tablet TAKE 1 TABLET(500 MG) BY MOUTH TWICE DAILY FOR 3 DAYS 07/24/22   Tresea Mall, CNM    Physical Exam Vitals: Blood pressure 120/80, height 5\' 7"  (1.702 m), weight 188 lb (85.3 kg).  General: NAD HEENT: normocephalic, anicteric Thyroid: no enlargement, no palpable nodules Pulmonary: No increased work of breathing, CTAB Cardiovascular: RRR, distal pulses 2+ Breast: Breast symmetrical, no tenderness, no palpable nodules or masses, no skin or nipple retraction present, no nipple discharge.  No axillary or supraclavicular lymphadenopathy. Abdomen: NABS, soft, non-tender, non-distended.  Umbilicus without lesions.  No hepatomegaly, splenomegaly or masses palpable. No evidence of hernia  Genitourinary:  External: Normal external female genitalia.  Normal urethral meatus, normal Bartholin's and Skene's glands.    Vagina: Normal vaginal mucosa, no evidence of prolapse.    Cervix: Grossly normal in appearance, friable, no CMT  Uterus: Non-enlarged, mobile, normal contour.   Adnexa: ovaries non-enlarged, no adnexal masses  Rectal: deferred  Lymphatic: no evidence of inguinal lymphadenopathy Extremities: no edema, erythema, or tenderness Neurologic: Grossly intact Psychiatric: mood appropriate, affect full  Female chaperone present for pelvic and breast  portions of the physical exam   Assessment: 46 y.o. G2P1011 routine annual exam  Plan: Problem List Items Addressed This Visit       Digestive   Herpes labialis   Relevant Medications   valACYclovir (VALTREX) 500 MG tablet   Other Visit Diagnoses     Well woman exam  with routine gynecological exam    -  Primary   Relevant Medications   valACYclovir (VALTREX) 500 MG tablet   Other Relevant Orders   Hgb A1c w/o eAG   Lipid Panel With LDL/HDL Ratio   CBC   Comprehensive metabolic panel   MM 3D SCREEN BREAST BILATERAL   Encounter for removal and reinsertion of IUD       Relevant Medications   levonorgestrel (LILETTA) 20.1 MCG/DAY IUD 1 each (Completed)   Screening for diabetes mellitus       Relevant Orders   Hgb A1c w/o eAG   Lipid screening       Relevant Orders   Lipid Panel With LDL/HDL Ratio   Breast screening       Relevant Orders   MM 3D SCREEN BREAST BILATERAL       1) Mammogram - recommend yearly screening mammogram.  Mammogram Was ordered today  2) STI screening  was offered and declined  3) ASCCP guidelines and rationale discussed.  Patient opts for every 5 years screening interval  4) Contraception - the patient is currently using  IUD.  She is happy with her current form of contraception and plans to continue  5) Colonoscopy: completed March 2023/10 year f/u -- Screening recommended  starting at age 69 for average risk individuals, age 28 for individuals deemed at increased risk (including African Americans) and recommended to continue until age 29.  For patient age 62-85 individualized approach is recommended.  Gold standard screening is via colonoscopy, Cologuard screening is an acceptable alternative for patient unwilling or unable to undergo colonoscopy.  "Colorectal cancer screening for average?risk adults: 2018 guideline update from the American Cancer Society"CA: A Cancer Journal for Clinicians: May 22, 2017   6) Routine healthcare maintenance including cholesterol, diabetes screening discussed Ordered today  7) Return in about 1 year (around 07/25/2023) for annual established gyn.   Tresea Mall, CNM [facility] 07/24/2022, 9:45 AM

## 2022-07-25 LAB — COMPREHENSIVE METABOLIC PANEL
ALT: 17 IU/L (ref 0–32)
AST: 21 IU/L (ref 0–40)
Albumin/Globulin Ratio: 1.9 (ref 1.2–2.2)
Albumin: 4.6 g/dL (ref 3.9–4.9)
Alkaline Phosphatase: 64 IU/L (ref 44–121)
BUN/Creatinine Ratio: 19 (ref 9–23)
BUN: 14 mg/dL (ref 6–24)
Bilirubin Total: 0.4 mg/dL (ref 0.0–1.2)
CO2: 26 mmol/L (ref 20–29)
Calcium: 9.5 mg/dL (ref 8.7–10.2)
Chloride: 102 mmol/L (ref 96–106)
Creatinine, Ser: 0.72 mg/dL (ref 0.57–1.00)
Globulin, Total: 2.4 g/dL (ref 1.5–4.5)
Glucose: 93 mg/dL (ref 70–99)
Potassium: 5.5 mmol/L — ABNORMAL HIGH (ref 3.5–5.2)
Sodium: 147 mmol/L — ABNORMAL HIGH (ref 134–144)
Total Protein: 7 g/dL (ref 6.0–8.5)
eGFR: 104 mL/min/{1.73_m2} (ref >59)

## 2022-07-25 LAB — LIPID PANEL WITH LDL/HDL RATIO
Cholesterol, Total: 225 mg/dL — ABNORMAL HIGH (ref 100–199)
HDL: 70 mg/dL (ref >39)
LDL Chol Calc (NIH): 142 mg/dL — ABNORMAL HIGH (ref 0–99)
LDL/HDL Ratio: 2 ratio (ref 0.0–3.2)
Triglycerides: 75 mg/dL (ref 0–149)
VLDL Cholesterol Cal: 13 mg/dL (ref 5–40)

## 2022-07-25 LAB — CBC
Hematocrit: 41.8 % (ref 34.0–46.6)
Hemoglobin: 13.9 g/dL (ref 11.1–15.9)
MCH: 31.7 pg (ref 26.6–33.0)
MCHC: 33.3 g/dL (ref 31.5–35.7)
MCV: 95 fL (ref 79–97)
Platelets: 241 10*3/uL (ref 150–450)
RBC: 4.39 x10E6/uL (ref 3.77–5.28)
RDW: 12.2 % (ref 11.7–15.4)
WBC: 6.4 10*3/uL (ref 3.4–10.8)

## 2022-07-25 LAB — HGB A1C W/O EAG: Hgb A1c MFr Bld: 5.3 % (ref 4.8–5.6)

## 2022-11-17 ENCOUNTER — Ambulatory Visit
Admission: EM | Admit: 2022-11-17 | Discharge: 2022-11-17 | Disposition: A | Discharge status: Home / Self Care | Payer: BC Managed Care – PPO

## 2022-11-17 DIAGNOSIS — B9689 Other specified bacterial agents as the cause of diseases classified elsewhere: Secondary | ICD-10-CM | POA: Diagnosis not present

## 2022-11-17 DIAGNOSIS — J209 Acute bronchitis, unspecified: Secondary | ICD-10-CM | POA: Diagnosis not present

## 2022-11-17 DIAGNOSIS — J069 Acute upper respiratory infection, unspecified: Secondary | ICD-10-CM

## 2022-11-17 MED ORDER — BENZONATATE 100 MG PO CAPS: ORAL_CAPSULE | ORAL | 0 refills | Status: DC

## 2022-11-17 MED ORDER — AMOXICILLIN-POT CLAVULANATE 875-125 MG PO TABS: 1.0000 | ORAL_TABLET | Freq: Two times a day (BID) | ORAL | 0 refills | Status: AC

## 2022-11-17 MED ORDER — HYDROCOD POLI-CHLORPHE POLI ER 10-8 MG/5ML PO SUER: 5.0000 mL | Freq: Two times a day (BID) | ORAL | 0 refills | Status: AC | PRN

## 2022-11-17 MED ORDER — PREDNISONE 20 MG PO TABS: 40.0000 mg | ORAL_TABLET | Freq: Every day | ORAL | 0 refills | Status: AC

## 2022-11-17 NOTE — ED Triage Notes (Signed)
Pt. Presents to UC w/ c/o a productive cough and fatigue, pt. States she has been sick since Nov. 10th and symptoms have not gotten better.

## 2022-11-17 NOTE — Discharge Instructions (Signed)
Follow up here or with your primary care provider if your symptoms are worsening or not improving with treatment.     

## 2022-11-17 NOTE — ED Provider Notes (Signed)
Renaldo Fiddler    CSN: [id number] Arrival date & time: 11/17/22  8676      History   Chief Complaint Chief Complaint  Patient presents with   Cough   Fatigue    HPI Taziyah ARMENTA ERSKIN is a 46 y.o. female.    Cough   Presents to urgent care with complaint of productive cough and fatigue.  Patient states she has been sick since November 10 and symptoms have not improved.  States that symptoms started with sore throat and progressed over weeks.  She is using OTC medications without symptom control.  Past Medical History:  Diagnosis Date   Herpes    LGSIL on Pap smear of cervix    SVT (supraventricular tachycardia)     Patient Active Problem List   Diagnosis Date Noted   Fatigue due to sleep pattern disturbance 12/23/2019   Herpes labialis 12/23/2019   History of cervical dysplasia 06/04/2017    Past Surgical History:  Procedure Laterality Date   BARIATRIC SURGERY     WISDOM TOOTH EXTRACTION      OB History     Gravida  2   Para  1   Term  1   Preterm      AB  1   Living  1      SAB      IAB  0   Ectopic      Multiple      Live Births               Home Medications    Prior to Admission medications   Medication Sig Start Date End Date Taking? Authorizing Provider  BD PEN NEEDLE MICRO U/F 32G X 6 MM MISC SMARTSIG:Pen Needle Injection 11/01/22  Yes [provider]  SAXENDA 18 MG/3ML SOPN Inject 3 mg into the skin daily. 10/23/22  Yes [provider]  levonorgestrel (LILETTA) 18.6 MCG/DAY IUD IUD 1 each by Intrauterine route once.    [provider]  metoprolol tartrate (LOPRESSOR) 25 MG tablet Take by mouth. 01/23/21   [provider]  valACYclovir (VALTREX) 500 MG tablet TAKE 1 TABLET(500 MG) BY MOUTH TWICE DAILY FOR 3 DAYS 07/24/22   Tresea Mall, CNM    Family History Family History  Problem Relation Age of Onset   Breast cancer Mother 5   Diabetes Father     Social  History Social History   Tobacco Use   Smoking status: Former   Smokeless tobacco: Never  Building services engineer Use: Never used  Substance Use Topics   Alcohol use: No   Drug use: No     Allergies   Patient has no known allergies.   Review of Systems Review of Systems  Respiratory:  Positive for cough.      Physical Exam Triage Vital Signs ED Triage Vitals [11/17/22 1030]  Enc Vitals Group     BP 121/76     Pulse Rate 85     Resp 18     Temp 98.4 F (36.9 C)     Temp src      SpO2 96 %     Weight      Height      Head Circumference      Peak Flow      Pain Score 0     Pain Loc      Pain Edu?      Excl. in GC?    No data found.  Updated  Vital Signs BP 121/76   Pulse 85   Temp 98.4 F (36.9 C)   Resp 18   LMP 10/29/2022 (Approximate)   SpO2 96%   Visual Acuity Right Eye Distance:   Left Eye Distance:   Bilateral Distance:    Right Eye Near:   Left Eye Near:    Bilateral Near:     Physical Exam Vitals reviewed.  Constitutional:      Appearance: Normal appearance.  Cardiovascular:     Rate and Rhythm: Normal rate and regular rhythm.     Pulses: Normal pulses.     Heart sounds: Normal heart sounds.  Pulmonary:     Effort: Pulmonary effort is normal.     Breath sounds: Normal breath sounds. No wheezing.  Skin:    General: Skin is warm and dry.  Neurological:     General: No focal deficit present.     Mental Status: She is alert and oriented to person, place, and time.  Psychiatric:        Mood and Affect: Mood normal.        Behavior: Behavior normal.      UC Treatments / Results  Labs (all labs ordered are listed, but only abnormal results are displayed) Labs Reviewed - No data to display  EKG   Radiology No results found.  Procedures Procedures (including critical care time)  Medications Ordered in UC Medications - No data to display  Initial Impression / Assessment and Plan / UC Course  I have reviewed the triage  vital signs and the nursing notes.  Pertinent labs & imaging results that were available during my care of the patient were reviewed by me and considered in my medical decision making (see chart for details).   Patient is afebrile here with recent antipyretics. Satting well on room air. Overall is ill appearing, well hydrated, without respiratory distress.  Nearly constant severe hacking cough is present and impedes respiratory exam.  Lungs CTAB without wheezing.  Given duration of symptoms, concern for secondary bacterial infection and will prescribe Augmentin x 10 days.  Will also recommend corticosteroid for relief of bronchitis.  Will provide benzonatate for daytime and Tussionex for nighttime relief of cough.  Final Clinical Impressions(s) / UC Diagnoses   Final diagnoses:  None   Discharge Instructions   None    ED Prescriptions   None    PDMP not reviewed this encounter.   Charma Igo, FNP 11/17/22 1101

## 2023-02-15 ENCOUNTER — Other Ambulatory Visit: Payer: Self-pay | Admitting: Advanced Practice Midwife

## 2023-02-15 DIAGNOSIS — Z1231 Encounter for screening mammogram for malignant neoplasm of breast: Secondary | ICD-10-CM

## 2023-03-05 ENCOUNTER — Ambulatory Visit
Admission: RE | Admit: 2023-03-05 | Discharge: 2023-03-05 | Disposition: A | Discharge status: Home / Self Care | Payer: BC Managed Care – PPO | Source: Ambulatory Visit | Attending: Advanced Practice Midwife | Admitting: Advanced Practice Midwife

## 2023-03-05 DIAGNOSIS — Z1231 Encounter for screening mammogram for malignant neoplasm of breast: Secondary | ICD-10-CM | POA: Diagnosis present

## 2023-11-23 ENCOUNTER — Ambulatory Visit: Payer: Self-pay

## 2024-05-21 ENCOUNTER — Other Ambulatory Visit: Payer: Self-pay | Admitting: Advanced Practice Midwife

## 2024-05-21 DIAGNOSIS — Z1231 Encounter for screening mammogram for malignant neoplasm of breast: Secondary | ICD-10-CM

## 2024-06-04 ENCOUNTER — Ambulatory Visit
Admission: RE | Admit: 2024-06-04 | Discharge: 2024-06-04 | Disposition: A | Discharge status: Home / Self Care | Payer: Self-pay | Source: Ambulatory Visit | Attending: Advanced Practice Midwife | Admitting: Advanced Practice Midwife

## 2024-06-04 DIAGNOSIS — Z1231 Encounter for screening mammogram for malignant neoplasm of breast: Secondary | ICD-10-CM | POA: Insufficient documentation

## 2024-07-21 ENCOUNTER — Ambulatory Visit (INDEPENDENT_AMBULATORY_CARE_PROVIDER_SITE_OTHER)

## 2024-07-21 VITALS — BP 116/74 | HR 72 | Temp 98.5°F | Ht 67.0 in | Wt 183.4 lb

## 2024-07-21 DIAGNOSIS — E7800 Pure hypercholesterolemia, unspecified: Secondary | ICD-10-CM | POA: Diagnosis not present

## 2024-07-21 DIAGNOSIS — M25512 Pain in left shoulder: Secondary | ICD-10-CM

## 2024-07-21 DIAGNOSIS — Z9884 Bariatric surgery status: Secondary | ICD-10-CM | POA: Insufficient documentation

## 2024-07-21 DIAGNOSIS — E875 Hyperkalemia: Secondary | ICD-10-CM | POA: Diagnosis not present

## 2024-07-21 DIAGNOSIS — G8929 Other chronic pain: Secondary | ICD-10-CM | POA: Insufficient documentation

## 2024-07-21 DIAGNOSIS — I471 Supraventricular tachycardia, unspecified: Secondary | ICD-10-CM | POA: Insufficient documentation

## 2024-07-21 DIAGNOSIS — Z131 Encounter for screening for diabetes mellitus: Secondary | ICD-10-CM | POA: Diagnosis not present

## 2024-07-21 DIAGNOSIS — N951 Menopausal and female climacteric states: Secondary | ICD-10-CM | POA: Insufficient documentation

## 2024-07-21 DIAGNOSIS — B009 Herpesviral infection, unspecified: Secondary | ICD-10-CM | POA: Insufficient documentation

## 2024-07-21 DIAGNOSIS — D229 Melanocytic nevi, unspecified: Secondary | ICD-10-CM | POA: Insufficient documentation

## 2024-07-21 DIAGNOSIS — D649 Anemia, unspecified: Secondary | ICD-10-CM | POA: Insufficient documentation

## 2024-07-21 DIAGNOSIS — M25562 Pain in left knee: Secondary | ICD-10-CM

## 2024-07-21 MED ORDER — MELOXICAM 7.5 MG PO TABS: 7.5000 mg | ORAL_TABLET | Freq: Every day | ORAL | 0 refills | Status: AC

## 2024-07-21 MED ORDER — VALACYCLOVIR HCL 500 MG PO TABS: ORAL_TABLET | ORAL | 6 refills | Status: AC

## 2024-07-21 NOTE — Assessment & Plan Note (Signed)
 Evident on previous lab, repeat CMP

## 2024-07-21 NOTE — Assessment & Plan Note (Signed)
-   Chronic pain with that worsens after prolonged sitting. - Refer to physical therapy for knee rehabilitation. - Advise Icy Hot and diclofenac gel for pain relief. PRN 7.5 mg once daily Meloxicam  for pain.  - Recommend Tylenol  500 mg, up to four times a day for pain.   - Suggest knee brace for stabilization if needed.  - Encourage regular activity and knee-strengthening exercises. - Ortho and physical therapy referral made.

## 2024-07-21 NOTE — Assessment & Plan Note (Signed)
 Order CBC, vitamin B12, and iron level tests.

## 2024-07-21 NOTE — Assessment & Plan Note (Addendum)
-   Symptoms include hot flashes, sleep disturbances, and nighttime anxiety. - Recommend follow-up with gynecologist for potential hormonal therapy evaluation. Will be due to remove IUD next year as well.  - Discuss potential cognitive behavioral therapy for sleep improvement. - If not a candidate for HRT, discussed potentially starting her on SSRI.

## 2024-07-21 NOTE — Assessment & Plan Note (Signed)
 Pain worsened by overhead movements and certain positions post-skiing injury, affecting daily activities. Refer to physical therapy for shoulder rehabilitation. Prescribe meloxicam  7.5 mg for pain management, prn once daily with food.  Advise diclofenac gel up to four times daily for local pain relief. Recommend Icy Hot for pain relief. Advise Tylenol  prn (2gm/24 hour) for pain control.  Ortho referral made on patient's request.

## 2024-07-21 NOTE — Assessment & Plan Note (Signed)
-   Continue metoprolol  12.5 mg twice daily. - Follow up and management per cardiologist Dr. Cara Lovelace at [facility]

## 2024-07-21 NOTE — Assessment & Plan Note (Signed)
 Surgery in 2013. Advise continued weight management strategies and regular exercise.

## 2024-07-21 NOTE — Progress Notes (Signed)
 New Patient Office Visit Changing PCP from [facility] due to proximity. Lives closer to our clinic and family members established with our clinic as well.     Subjective   Patient ID: Olivia Patterson, female    DOB: 09-28-1976  Age: 48 y.o. MRN: [id number]  CC:  Chief Complaint  Patient presents with   Establish Care   Shoulder Pain    Patient states she hurt her L shoulder during a fall from skiing. Patient says she was not seen anywhere nor treated with any medication. She takes over the counter pain relief to help with pain and discomfort. Patient states it is painful to put on and remove clothes and pain radiates down to her elbow.    HPI Olivia Patterson presents to establish care. She  has a past medical history of LGSIL on Pap smear of cervix and SVT (supraventricular tachycardia) (HCC).  HPI Discussed the use of AI scribe software for clinical note transcription with the patient, who gave verbal consent to proceed.  History of Present Illness Olivia Patterson is a 48 year old female with supraventricular tachycardia who presents for a routine follow-up and medication management.  - She has a history of supraventricular tachycardia and is currently taking metoprolol  12.5 mg twice a day. She occasionally experiences episodes of increased heart rate triggered by eating or bending down. No chest pain or palpitations. Plans on following up with [facility] cardiology Dr. Cara Lovelace.   - She is on semaglutide for weight loss, which she self-adjusted to a lower dose due to adverse effects. She reports significant weight loss and is considering maintaining the current dose. The medication is obtained from a compound pharmacy in [place]. She does not recall dose of medication. She also has a h/o gastric sleeve surgery in 2013. With weight loss patient is no longer on CPAP for OSA.    - She has a history of herpes simplex virus infection, primarily experiencing symptoms on her  lips and occasionally on her teeth. She takes Valtrex  500 mg twice a day for three days at the onset of symptoms. She requests a refill to keep on hand.  -  IUD placed in 2018 for birth control, which is due for removal next year. Established with [facility]. She mentions experiencing perimenopausal symptoms, including hot flashes and sleep disturbances, over the past few months.   - She reports occasional alcohol consumption, primarily during the summer.  - She experiences left knee pain, described as stiffness, which has been ongoing for years. The pain is exacerbated by prolonged sitting and relieved by [facility] application. She has not seen an orthopedic specialist for this issue. She used to be an athlete growing up and contributes pain to that. No obvious trauma to the knee.   - She reports left shoulder pain following a skiing accident in January, 2025 with pain exacerbated by overhead activities and lying on the affected side. She has not sought medical evaluation for this injury.   - She occasionally donates plasma for money and was turned away recently due to low hematocrit levels. She drinks flavored water to stay hydrated.   Outpatient Encounter Medications as of 07/21/2024  Medication Sig   levonorgestrel  (LILETTA ) 18.6 MCG/DAY IUD IUD 1 each by Intrauterine route once.   meloxicam  (MOBIC ) 7.5 MG tablet Take 1 tablet (7.5 mg total) by mouth daily.   metoprolol  tartrate (LOPRESSOR ) 25 MG tablet Take 12.5 mg by mouth 2 (two) times daily.  SEMAGLUTIDE-WEIGHT MANAGEMENT [place] Inject into the skin.   [DISCONTINUED] metoprolol  tartrate (LOPRESSOR ) 25 MG tablet Take by mouth.   [DISCONTINUED] SEMAGLUTIDE-WEIGHT MANAGEMENT    [DISCONTINUED] valACYclovir  (VALTREX ) 500 MG tablet TAKE 1 TABLET(500 MG) BY MOUTH TWICE DAILY FOR 3 DAYS   valACYclovir  (VALTREX ) 500 MG tablet TAKE 1 TABLET(500 MG) BY MOUTH TWICE DAILY FOR 3 DAYS   [DISCONTINUED] BD PEN NEEDLE MICRO U/F 32G X 6 MM MISC  SMARTSIG:Pen Needle Injection   [DISCONTINUED] benzonatate  (TESSALON ) 100 MG capsule Take 1-2 tablets 3 times a day as needed for cough   [DISCONTINUED] SAXENDA 18 MG/3ML SOPN Inject 3 mg into the skin daily.   No facility-administered encounter medications on file as of 07/21/2024.    Past Surgical History:  Procedure Laterality Date   BARIATRIC SURGERY     2013   WISDOM TOOTH EXTRACTION      ROS As per HPI    Objective    BP 116/74 (BP Location: Right Arm, Patient Position: Sitting, Cuff Size: Normal)   Pulse 72   Temp 98.5 F (36.9 C) (Oral)   Ht 5' 7 (1.702 m)   Wt 183 lb 6.4 oz (83.2 kg)   SpO2 95%   BMI 28.72 kg/m     07/21/2024    3:49 PM  Depression screen PHQ 2/9  Decreased Interest 0  Down, Depressed, Hopeless 0  PHQ - 2 Score 0  Altered sleeping 1  Tired, decreased energy 0  Change in appetite 0  Feeling bad or failure about yourself  0  Trouble concentrating 1  Moving slowly or fidgety/restless 0  Suicidal thoughts 0  PHQ-9 Score 2  Difficult doing work/chores Not difficult at all      07/21/2024    3:49 PM  GAD 7 : Generalized Anxiety Score  Nervous, Anxious, on Edge 0  Control/stop worrying 0  Worry too much - different things 0  Trouble relaxing 1  Restless 0  Easily annoyed or irritable 0  Afraid - awful might happen 0  Total GAD 7 Score 1  Anxiety Difficulty Not difficult at all    SDOH Screenings   Food Insecurity: No Food Insecurity (07/21/2024)  Housing: Low Risk  (07/21/2024)  Transportation Needs: No Transportation Needs (07/21/2024)  Alcohol Screen: Low Risk  (07/21/2024)  Depression (PHQ2-9): Low Risk  (07/21/2024)  Financial Resource Strain: Low Risk  (07/21/2024)  Physical Activity: Insufficiently Active (07/21/2024)  Social Connections: Moderately Integrated (07/21/2024)  Stress: Stress Concern Present (07/21/2024)  Tobacco Use: Medium Risk (07/21/2024)   Physical Exam Constitutional:      General: She is not in acute  distress.    Appearance: Normal appearance.  HENT:     Head: Normocephalic and atraumatic.     Right Ear: Tympanic membrane normal.     Left Ear: Tympanic membrane normal.     Mouth/Throat:     Mouth: Mucous membranes are moist.  Neck:     Thyroid : No thyroid  mass or thyroid  tenderness.  Cardiovascular:     Rate and Rhythm: Normal rate and regular rhythm.  Pulmonary:     Effort: Pulmonary effort is normal.     Breath sounds: Normal breath sounds.  Abdominal:     General: Bowel sounds are normal.     Palpations: Abdomen is soft.     Tenderness: There is no guarding.  Musculoskeletal:     Right shoulder: Normal strength. Normal pulse.     Left shoulder: No swelling, deformity, effusion or crepitus. Decreased range of  motion (extension, external rotation due to pain). Normal strength. Normal pulse.     Cervical back: Neck supple. No rigidity.     Right knee: No swelling or crepitus.     Left knee: No swelling or crepitus.     Right lower leg: No edema.     Left lower leg: No edema.     Comments: Bilateral varicose veins noted without lower leg edema  Skin:    General: Skin is warm.     Comments: Multiple benign appearing nevus on patient's back    Neurological:     Mental Status: She is alert and oriented to person, place, and time.     Gait: Gait normal.  Psychiatric:        Mood and Affect: Mood normal.        Behavior: Behavior normal.        Lab Results  Component Value Date   TSH 0.988 07/14/2021   Lab Results  Component Value Date   WBC 6.4 07/24/2022   HGB 13.9 07/24/2022   HCT 41.8 07/24/2022   MCV 95 07/24/2022   PLT 241 07/24/2022   Lab Results  Component Value Date   NA 147 (H) 07/24/2022   K 5.5 (H) 07/24/2022   CO2 26 07/24/2022   GLUCOSE 93 07/24/2022   BUN 14 07/24/2022   CREATININE 0.72 07/24/2022   BILITOT 0.4 07/24/2022   ALKPHOS 64 07/24/2022   AST 21 07/24/2022   ALT 17 07/24/2022   PROT 7.0 07/24/2022   ALBUMIN 4.6 07/24/2022    CALCIUM 9.5 07/24/2022   ANIONGAP 7 11/15/2020   EGFR 104 07/24/2022   Lab Results  Component Value Date   CHOL 225 (H) 07/24/2022   CHOL 175 07/14/2021   CHOL 217 (H) 06/16/2019   Lab Results  Component Value Date   HDL 70 07/24/2022   HDL 56 07/14/2021   HDL 59 06/16/2019   Lab Results  Component Value Date   LDLCALC 142 (H) 07/24/2022   LDLCALC 105 (H) 07/14/2021   LDLCALC 142 (H) 06/16/2019   Lab Results  Component Value Date   TRIG 75 07/24/2022   TRIG 75 07/14/2021   TRIG 81 06/16/2019   Lab Results  Component Value Date   CHOLHDL 3.1 07/14/2021   CHOLHDL 3.7 06/16/2019   Lab Results  Component Value Date   HGBA1C 5.3 07/24/2022   HGBA1C 5.6 04/29/2012   Assessment & Plan:  Patient presenting to establish care.   HSV (herpes simplex virus) infection Assessment & Plan: - Rare outbreaks, managed with Valtrex  as needed. - Prescribe Valtrex  500 mg twice daily for three days at symptom onset.  Orders: -     valACYclovir  HCl; TAKE 1 TABLET(500 MG) BY MOUTH TWICE DAILY FOR 3 DAYS  Dispense: 6 tablet; Refill: 6  Elevated LDL cholesterol level Assessment & Plan: Order fasting lipid panel. Advise regular exercise and dietary modifications to increase fiber and reduce alcohol.  Orders: -     Cholesterol, total; Future  Hyperkalemia Assessment & Plan: Evident on previous lab, repeat CMP  Orders: -     Comprehensive metabolic panel with GFR; Future  Screening for diabetes mellitus Assessment & Plan: Check A1c, ordered  Orders: -     Hemoglobin A1c; Future  Low hematocrit Assessment & Plan:  Order CBC, vitamin B12, and iron level tests.   Orders: -     CBC with Differential/Platelet; Future -     Vitamin B12; Future -  Iron, TIBC and Ferritin Panel; Future  Chronic left shoulder pain Assessment & Plan: Pain worsened by overhead movements and certain positions post-skiing injury, affecting daily activities. Refer to physical therapy for  shoulder rehabilitation. Prescribe meloxicam  7.5 mg for pain management, prn once daily with food.  Advise diclofenac gel up to four times daily for local pain relief. Recommend Icy Hot for pain relief. Advise Tylenol  prn (2gm/24 hour) for pain control.  Ortho referral made on patient's request.   Orders: -     Meloxicam ; Take 1 tablet (7.5 mg total) by mouth daily.  Dispense: 30 tablet; Refill: 0 -     Ambulatory referral to Physical Therapy; Future -     Ambulatory referral to Orthopedic Surgery  Chronic pain of left knee Assessment & Plan: - Chronic pain with that worsens after prolonged sitting. - Refer to physical therapy for knee rehabilitation. - Advise Icy Hot and diclofenac gel for pain relief. PRN 7.5 mg once daily Meloxicam  for pain.  - Recommend Tylenol  500 mg, up to four times a day for pain.   - Suggest knee brace for stabilization if needed.  - Encourage regular activity and knee-strengthening exercises. - Ortho and physical therapy referral made.   Orders: -     Meloxicam ; Take 1 tablet (7.5 mg total) by mouth daily.  Dispense: 30 tablet; Refill: 0 -     Ambulatory referral to Physical Therapy; Future -     Ambulatory referral to Orthopedic Surgery  Numerous moles Assessment & Plan: - Refer to dermatology for evaluation and monitoring. - Advise monitoring for changes in size or appearance of moles.  Orders: -     Ambulatory referral to Dermatology  SVT (supraventricular tachycardia) (HCC) Assessment & Plan: - Continue metoprolol  12.5 mg twice daily. - Follow up and management per cardiologist Dr. Cara Lo[facility]ace at [facility].   Orders: -     TSH; Future  Status post bariatric surgery Assessment & Plan: Surgery in 2013. Advise continued weight management strategies and regular exercise.   Perimenopausal Assessment & Plan: - Symptoms include hot flashes, sleep disturbances, and nighttime anxiety. - Recommend follow-up with gynecologist for potential  hormonal therapy evaluation. Will be due to remove IUD next year as well.  - Discuss potential cognitive behavioral therapy for sleep improvement. - If not a candidate for HRT, discussed potentially starting her on SSRI.     I spent 60 minutes on the day of this face-to-face encounter reviewing the patient's medical and surgical history, medications, ongoing concerns, and reviewing the assessment and plan with the patient. This time also included counseling the patient on their health conditions and management options. Additionally, I spent time post-visit ordering and reviewing diagnostics and therapeutics with the patient.   Return in about 6 months (around 01/21/2025) for Chronic follow up .   Luke Shade, MD

## 2024-07-21 NOTE — Assessment & Plan Note (Signed)
 Check A1c, ordered

## 2024-07-21 NOTE — Assessment & Plan Note (Signed)
 Order fasting lipid panel. Advise regular exercise and dietary modifications to increase fiber and reduce alcohol.

## 2024-07-21 NOTE — Assessment & Plan Note (Signed)
-   Rare outbreaks, managed with Valtrex  as needed. - Prescribe Valtrex  500 mg twice daily for three days at symptom onset.

## 2024-07-21 NOTE — Assessment & Plan Note (Signed)
-   Refer to dermatology for evaluation and monitoring. - Advise monitoring for changes in size or appearance of moles.

## 2024-07-24 ENCOUNTER — Other Ambulatory Visit (INDEPENDENT_AMBULATORY_CARE_PROVIDER_SITE_OTHER)

## 2024-07-24 DIAGNOSIS — Z131 Encounter for screening for diabetes mellitus: Secondary | ICD-10-CM | POA: Diagnosis not present

## 2024-07-24 DIAGNOSIS — D649 Anemia, unspecified: Secondary | ICD-10-CM | POA: Diagnosis not present

## 2024-07-24 DIAGNOSIS — I471 Supraventricular tachycardia, unspecified: Secondary | ICD-10-CM | POA: Diagnosis not present

## 2024-07-24 DIAGNOSIS — E7800 Pure hypercholesterolemia, unspecified: Secondary | ICD-10-CM | POA: Diagnosis not present

## 2024-07-24 DIAGNOSIS — E875 Hyperkalemia: Secondary | ICD-10-CM

## 2024-07-24 LAB — COMPREHENSIVE METABOLIC PANEL WITH GFR
ALT: 12 U/L (ref 0–35)
AST: 14 U/L (ref 0–37)
Albumin: 3.9 g/dL (ref 3.5–5.2)
Alkaline Phosphatase: 41 U/L (ref 39–117)
BUN: 14 mg/dL (ref 6–23)
CO2: 30 meq/L (ref 19–32)
Calcium: 8.8 mg/dL (ref 8.4–10.5)
Chloride: 104 meq/L (ref 96–112)
Creatinine, Ser: 0.62 mg/dL (ref 0.40–1.20)
GFR: 105.19 mL/min (ref >60.00)
Glucose, Bld: 87 mg/dL (ref 70–99)
Potassium: 4.5 meq/L (ref 3.5–5.1)
Sodium: 139 meq/L (ref 135–145)
Total Bilirubin: 0.3 mg/dL (ref 0.2–1.2)
Total Protein: 5.7 g/dL — ABNORMAL LOW (ref 6.0–8.3)

## 2024-07-24 LAB — CBC WITH DIFFERENTIAL/PLATELET
Basophils Absolute: 0 K/uL (ref 0.0–0.1)
Basophils Relative: 0.2 % (ref 0.0–3.0)
Eosinophils Absolute: 0.1 K/uL (ref 0.0–0.7)
Eosinophils Relative: 2.4 % (ref 0.0–5.0)
HCT: 39.8 % (ref 36.0–46.0)
Hemoglobin: 13.4 g/dL (ref 12.0–15.0)
Lymphocytes Relative: 30.5 % (ref 12.0–46.0)
Lymphs Abs: 1.7 K/uL (ref 0.7–4.0)
MCHC: 33.7 g/dL (ref 30.0–36.0)
MCV: 93.3 fl (ref 78.0–100.0)
Monocytes Absolute: 0.5 K/uL (ref 0.1–1.0)
Monocytes Relative: 9.5 % (ref 3.0–12.0)
Neutro Abs: 3.2 K/uL (ref 1.4–7.7)
Neutrophils Relative %: 57.4 % (ref 43.0–77.0)
Platelets: 197 K/uL (ref 150.0–400.0)
RBC: 4.27 Mil/uL (ref 3.87–5.11)
RDW: 11.7 % (ref 11.5–15.5)
WBC: 5.7 K/uL (ref 4.0–10.5)

## 2024-07-24 LAB — HEMOGLOBIN A1C: Hgb A1c MFr Bld: 5.5 % (ref 4.6–6.5)

## 2024-07-24 LAB — CHOLESTEROL, TOTAL: Cholesterol: 198 mg/dL (ref 0–200)

## 2024-07-24 LAB — VITAMIN B12: Vitamin B-12: 220 pg/mL (ref 211–911)

## 2024-07-24 LAB — TSH: TSH: 1.68 u[IU]/mL (ref 0.35–5.50)

## 2024-07-27 ENCOUNTER — Ambulatory Visit: Payer: Self-pay

## 2024-07-28 ENCOUNTER — Ambulatory Visit: Admitting: Dermatology

## 2024-07-28 ENCOUNTER — Encounter: Payer: Self-pay | Admitting: Dermatology

## 2024-07-28 DIAGNOSIS — D229 Melanocytic nevi, unspecified: Secondary | ICD-10-CM

## 2024-07-28 DIAGNOSIS — L821 Other seborrheic keratosis: Secondary | ICD-10-CM | POA: Diagnosis not present

## 2024-07-28 DIAGNOSIS — D1801 Hemangioma of skin and subcutaneous tissue: Secondary | ICD-10-CM

## 2024-07-28 DIAGNOSIS — L578 Other skin changes due to chronic exposure to nonionizing radiation: Secondary | ICD-10-CM | POA: Diagnosis not present

## 2024-07-28 DIAGNOSIS — L814 Other melanin hyperpigmentation: Secondary | ICD-10-CM | POA: Diagnosis not present

## 2024-07-28 DIAGNOSIS — W908XXA Exposure to other nonionizing radiation, initial encounter: Secondary | ICD-10-CM

## 2024-07-28 DIAGNOSIS — Z1283 Encounter for screening for malignant neoplasm of skin: Secondary | ICD-10-CM

## 2024-07-28 DIAGNOSIS — D2272 Melanocytic nevi of left lower limb, including hip: Secondary | ICD-10-CM

## 2024-07-28 DIAGNOSIS — Z808 Family history of malignant neoplasm of other organs or systems: Secondary | ICD-10-CM

## 2024-07-28 DIAGNOSIS — L813 Cafe au lait spots: Secondary | ICD-10-CM

## 2024-07-28 LAB — IRON,TIBC AND FERRITIN PANEL
%SAT: 24 % (ref 16–45)
Ferritin: 40 ng/mL (ref 16–232)
Iron: 78 ug/dL (ref 40–190)
TIBC: 321 ug/dL (ref 250–450)

## 2024-07-28 NOTE — Progress Notes (Signed)
   New Patient Visit   Subjective  Olivia Patterson is a 48 y.o. female who presents for the following: Skin Cancer Screening and Full Body Skin Exam. Has spots on back and right arm that she is concerned about.   The patient presents for Total-Body Skin Exam (TBSE) for skin cancer screening and mole check. The patient has spots, moles and lesions to be evaluated, some may be new or changing and the patient may have concern these could be cancer.  The following portions of the chart were reviewed this encounter and updated as appropriate: medications, allergies, medical history  Review of Systems:  No other skin or systemic complaints except as noted in HPI or Assessment and Plan.  Objective  Well appearing patient in no apparent distress; mood and affect are within normal limits.  A full examination was performed including scalp, head, eyes, ears, nose, lips, neck, chest, axillae, abdomen, back, buttocks, bilateral upper extremities, bilateral lower extremities, hands, feet, fingers, toes, fingernails, and toenails. All findings within normal limits unless otherwise noted below.   Relevant physical exam findings are noted in the Assessment and Plan.   Assessment & Plan   SKIN CANCER SCREENING PERFORMED TODAY.  ACTINIC DAMAGE - Chronic condition, secondary to cumulative UV/sun exposure - diffuse scaly erythematous macules with underlying dyspigmentation - Recommend daily broad spectrum sunscreen SPF 30+ to sun-exposed areas, reapply every 2 hours as needed.  - Staying in the shade or wearing long sleeves, sun glasses (UVA+UVB protection) and wide brim hats (4-inch brim around the entire circumference of the hat) are also recommended for sun protection.  - Call for new or changing lesions.  LENTIGINES, SEBORRHEIC KERATOSES, HEMANGIOMAS - Benign normal skin lesions - Benign-appearing - Call for any changes  MELANOCYTIC NEVI - Tan-brown and/or pink-flesh-colored symmetric macules  and papules - Benign appearing on exam today - Observation - Call clinic for new or changing moles - Recommend daily use of broad spectrum spf 30+ sunscreen to sun-exposed areas.   FAMILY HISTORY OF SKIN CANCER What type(s): unknown Who affected: brother  Cafe au Lait  - Tan patch on right arm, right posterior thigh - Genetic - Benign, observe - Call for any changes   MELANOCYTIC NEVUS    Exam: Between the L 4th and 5th toe, on the 4th toe, 4.0 x 3.0 mm Treatment Plan: Benign appearing on exam today. Recommend observation. Call clinic for new or changing moles. Recommend daily use of broad spectrum spf 30+ sunscreen to sun-exposed areas.   MULTIPLE BENIGN NEVI   LENTIGINES   ACTINIC ELASTOSIS   SEBORRHEIC KERATOSES   CHERRY ANGIOMA   CAFE AU LAIT SPOTS    Return in about 1 year (around 07/28/2025) for TBSE.  Documentation: I have reviewed the above documentation for accuracy and completeness, and I agree with the above.  Boneta Sharps, MD

## 2024-07-28 NOTE — Patient Instructions (Signed)

## 2024-08-07 ENCOUNTER — Telehealth: Payer: Self-pay | Admitting: Orthopedic Surgery

## 2024-08-07 NOTE — Telephone Encounter (Signed)
 Called pt 1X and left vm for pt to call and reschedule with [facility]. Provider not in office. First available appt with [facility]

## 2024-08-20 ENCOUNTER — Ambulatory Visit: Admitting: Orthopedic Surgery

## 2024-08-22 ENCOUNTER — Other Ambulatory Visit: Payer: Self-pay

## 2024-08-22 DIAGNOSIS — G8929 Other chronic pain: Secondary | ICD-10-CM

## 2024-08-27 ENCOUNTER — Other Ambulatory Visit: Payer: Self-pay

## 2024-08-27 DIAGNOSIS — G8929 Other chronic pain: Secondary | ICD-10-CM

## 2024-08-27 NOTE — Telephone Encounter (Signed)
 Copied from CRM [id number]. Topic: Clinical - Medication Refill >> Aug 27, 2024 11:38 AM Jayma L wrote: Medication:  meloxicam  (MOBIC ) 7.5 MG tablet    Has the patient contacted their pharmacy? Yes (Agent: If no, request that the patient contact the pharmacy for the refill. If patient does not wish to contact the pharmacy document the reason why and proceed with request.) (Agent: If yes, when and what did the pharmacy advise?)  This is the patient's preferred pharmacy:  [id number] [place], [place] - [address] AT [facility] OF [address] [place] [address] Phone: [phone] Fax: [phone]  [facility] [address], [place] - [address] [place] [address] Phone: [phone] Fax: [phone]  [id number] - [place], [place] - [address] AT [facility] [place] [address] [facility] [address] [place] [address] Phone: [phone] Fax: [phone]  Is this the correct pharmacy for this prescription? Yes If no, delete pharmacy and type the correct one.   Has the prescription been filled recently? No  Is the patient out of the medication? Yes  Has the patient been seen for an appointment in the last year OR does the patient have an upcoming appointment? Yes  Can we respond through MyChart? No  Agent: Please be advised that Rx refills may take up to 3 business days. We ask that you follow-up with your pharmacy.

## 2024-09-11 ENCOUNTER — Other Ambulatory Visit: Payer: Self-pay

## 2024-09-11 DIAGNOSIS — M25512 Pain in left shoulder: Secondary | ICD-10-CM

## 2024-09-11 DIAGNOSIS — M25562 Pain in left knee: Secondary | ICD-10-CM

## 2024-09-17 ENCOUNTER — Ambulatory Visit
Admission: RE | Admit: 2024-09-17 | Discharge: 2024-09-17 | Disposition: A | Discharge status: Home / Self Care | Attending: Orthopedic Surgery | Admitting: Orthopedic Surgery

## 2024-09-17 ENCOUNTER — Encounter: Payer: Self-pay | Admitting: Orthopedic Surgery

## 2024-09-17 ENCOUNTER — Ambulatory Visit (INDEPENDENT_AMBULATORY_CARE_PROVIDER_SITE_OTHER): Admitting: Orthopedic Surgery

## 2024-09-17 ENCOUNTER — Ambulatory Visit
Admission: RE | Admit: 2024-09-17 | Discharge: 2024-09-17 | Disposition: A | Discharge status: Home / Self Care | Source: Ambulatory Visit | Attending: Orthopedic Surgery | Admitting: Orthopedic Surgery

## 2024-09-17 DIAGNOSIS — M25512 Pain in left shoulder: Secondary | ICD-10-CM | POA: Insufficient documentation

## 2024-09-17 DIAGNOSIS — M25562 Pain in left knee: Secondary | ICD-10-CM

## 2024-09-17 DIAGNOSIS — G8929 Other chronic pain: Secondary | ICD-10-CM | POA: Diagnosis not present

## 2024-09-17 NOTE — Patient Instructions (Addendum)
 Instructions Following Joint Injections  In clinic today, you received an injection in one of your joints (sometimes more than one).  Occasionally, you can have some pain at the injection site, this is normal.  You can place ice at the injection site, or take over-the-counter medications such as Tylenol  (acetaminophen ) or Advil (ibuprofen).  Please follow all directions listed on the bottle.  If your joint (knee or shoulder) becomes swollen, red or very painful, please contact the clinic for additional assistance.   Two medications were injected, including lidocaine and a steroid (often referred to as cortisone).  Lidocaine is effective almost immediately but wears off quickly.  However, the steroid can take a few days to improve your symptoms.  In some cases, it can make your pain worse for a couple of days.  Do not be concerned if this happens as it is common.  You can apply ice or take some over-the-counter medications as needed.   Injections in the same joint cannot be repeated for 3 months.  This helps to limit the risk of an infection in the joint.  If you were to develop an infection in your joint, the best treatment option would be surgery.    Rotator Cuff Tear/Tendinitis Rehab   Ask your health care provider which exercises are safe for you. Do exercises exactly as told by your health care provider and adjust them as directed. It is normal to feel mild stretching, pulling, tightness, or discomfort as you do these exercises. Stop right away if you feel sudden pain or your pain gets worse. Do not begin these exercises until told by your health care provider. Stretching and range-of-motion exercises  These exercises warm up your muscles and joints and improve the movement and flexibility of your shoulder. These exercises also help to relieve pain.  Shoulder pendulum In this exercise, you let the injured arm dangle toward the floor and then swing it like a clock pendulum. Stand near a table  or counter that you can hold onto for balance. Bend forward at the waist and let your left / right arm hang straight down. Use your other arm to support you and help you stay balanced. Relax your left / right arm and shoulder muscles, and move your hips and your trunk so your left / right arm swings freely. Your arm should swing because of the motion of your body, not because you are using your arm or shoulder muscles. Keep moving your hips and trunk so your arm swings in the following directions, as told by your health care provider: Side to side. Forward and backward. In clockwise and counterclockwise circles. Slowly return to the starting position. Repeat 10 times, or for 10 seconds per direction. Complete this exercise 2-3 times a day.      Shoulder flexion, seated This exercise is sometimes called table slides. In this exercise, you raise your arm in front of your body until you feel a stretch in your injured shoulder. Sit in a stable chair so your left / right forearm can rest on a flat surface. Your elbow should rest at a height that keeps your upper arm next to your body. Keeping your left / right shoulder relaxed, lean forward at the waist and let your hand slide forward (flexion). Stop when you feel a stretch in your shoulder, or when you reach the angle that is recommended by your health care provider. Hold for 5 seconds. Slowly return to the starting position. Repeat 10 times. Complete this  exercise 1-2  times a day.       Shoulder flexion, standing In this exercise, you raise your arm in front of your body (flexion) until you feel a stretch in your injured shoulder. Stand and hold a broomstick, a cane, or a similar object. Place your hands a little more than shoulder-width apart on the object. Your left / right hand should be palm-up, and your other hand should be palm-down. Keep your elbow straight and your shoulder muscles relaxed. Push the stick up with your healthy arm to raise  your left / right arm in front of your body, and then over your head until you feel a stretch in your shoulder. Avoid shrugging your shoulder while you raise your arm. Keep your shoulder blade tucked down toward the middle of your back. Keep your left / right shoulder muscles relaxed. Hold for 10 seconds. Slowly return to the starting position. Repeat 10 times. Complete this exercise 1-2 times a day.      Shoulder abduction, active-assisted You will need a stick, broom handle, or similar object to help you (assist) in doing this exercise. Lie on your back. This is the supine position. Hold a broomstick, a cane, or a similar object. Place your hands a little more than shoulder-width apart on the object. Your left / right hand should be palm-up, and your other hand should be palm-down. Keeping your shoulder relaxed, push the stick to raise your left / right arm out to your side (abduction) and then over your head. Use your other hand to help move the stick. Stop when you feel a stretch in your shoulder, or when you reach the angle that is recommended by your health care provider. Avoid shrugging your shoulder while you raise your arm. Keep your shoulder blade tucked down toward the middle of your back. Hold for 10 seconds. Slowly return to the starting position. Repeat 10 times. Complete this exercise 1-2 times a day.      Shoulder flexion, active-assisted Lie on your back. You may bend your knees for comfort. Hold a broomstick, a cane, or a similar object so that your hands are about shoulder-width apart. Your palms should face toward your feet. Raise your left / right arm over your head, then behind your head toward the floor (flexion). Use your other hand to help you do this (active-assisted). Stop when you feel a gentle stretch in your shoulder, or when you reach the angle that is recommended by your health care provider. Hold for 10 seconds. Use the stick and your other arm to help you  return your left / right arm to the starting position. Repeat 10 times. Complete this exercise 1-2 times a day.      External rotation Sit in a stable chair without armrests, or stand up. Tuck a soft object, such as a folded towel or a small ball, under your left / right upper arm. Hold a broomstick, a cane, or a similar object with your palms face-down, toward the floor. Bend your elbows to a 90-degree angle (right angle), and keep your hands about shoulder-width apart. Straighten your healthy arm and push the stick across your body, toward your left / right side. Keep your left / right arm bent. This will rotate your left / right forearm away from your body (external rotation). Hold for 10 seconds. Slowly return to the starting position. Repeat 10 times. Complete this exercise 1-2 times a day.        Strengthening exercises  These exercises build strength and endurance in your shoulder. Endurance is the ability to use your muscles for a long time, even after they get tired. Do not start doing these exercises until your health care provider approves. Shoulder flexion, isometric Stand or sit in a doorway, facing the door frame. Keep your left / right arm straight and make a gentle fist with your hand. Place your fist against the door frame. Only your fist should be touching the frame. Keep your upper arm at your side. Gently press your fist against the door frame, as if you are trying to raise your arm above your head (isometric shoulder flexion). Avoid shrugging your shoulder while you press your hand into the door frame. Keep your shoulder blade tucked down toward the middle of your back. Hold for 10 seconds. Slowly release the tension, and relax your muscles completely before you repeat the exercise. Repeat 10 times. Complete this exercise 3 times per week.      Shoulder abduction, isometric Stand or sit in a doorway. Your left / right arm should be closest to the door frame. Keep your  left / right arm straight, and place the back of your hand against the door frame. Only your hand should be touching the frame. Keep the rest of your arm close to your side. Gently press the back of your hand against the door frame, as if you are trying to raise your arm out to the side (isometric shoulder abduction). Avoid shrugging your shoulder while you press your hand into the door frame. Keep your shoulder blade tucked down toward the middle of your back. Hold for 10 seconds. Slowly release the tension, and relax your muscles completely before you repeat the exercise. Repeat 10 times. Complete this exercise 3 times per week.      Internal rotation, isometric This is an exercise in which you press your palm against a door frame without moving your shoulder joint (isometric). Stand or sit in a doorway, facing the door frame. Bend your left / right elbow, and place the palm of your hand against the door frame. Only your palm should be touching the frame. Keep your upper arm at your side. Gently press your hand against the door frame, as if you are trying to push your arm toward your abdomen (internal rotation). Gradually increase the pressure until you are pressing as hard as you can. Stop increasing the pressure if you feel shoulder pain. Avoid shrugging your shoulder while you press your hand into the door frame. Keep your shoulder blade tucked down toward the middle of your back. Hold for 10 seconds. Slowly release the tension, and relax your muscles completely before you repeat the exercise. Repeat 10 times. Complete this exercise 3 times per week.      External rotation, isometric This is an exercise in which you press the back of your wrist against a door frame without moving your shoulder joint (isometric). Stand or sit in a doorway, facing the door frame. Bend your left / right elbow and place the back of your wrist against the door frame. Only the back of your wrist should be  touching the frame. Keep your upper arm at your side. Gently press your wrist against the door frame, as if you are trying to push your arm away from your abdomen (external rotation). Gradually increase the pressure until you are pressing as hard as you can. Stop increasing the pressure if you feel pain. Avoid shrugging your shoulder while  you press your wrist into the door frame. Keep your shoulder blade tucked down toward the middle of your back. Hold for 10 seconds. Slowly release the tension, and relax your muscles completely before you repeat the exercise. Repeat 10 times. Complete this exercise 3 times per week.       Scapular retraction Sit in a stable chair without armrests, or stand up. Secure an exercise band to a stable object in front of you so the band is at shoulder height. Hold one end of the exercise band in each hand. Your palms should face down. Squeeze your shoulder blades together (retraction) and move your elbows slightly behind you. Do not shrug your shoulders upward while you do this. Hold for 10 seconds. Slowly return to the starting position. Repeat 10 times. Complete this exercise 3 times per week.      Shoulder extension Sit in a stable chair without armrests, or stand up. Secure an exercise band to a stable object in front of you so the band is above shoulder height. Hold one end of the exercise band in each hand. Straighten your elbows and lift your hands up to shoulder height. Squeeze your shoulder blades together and pull your hands down to the sides of your thighs (extension). Stop when your hands are straight down by your sides. Do not let your hands go behind your body. Hold for 10 seconds. Slowly return to the starting position. Repeat 10 times. Complete this exercise 3 times per week.       Scapular protraction, supine Lie on your back on a firm surface (supine position). Hold a 5 lbs (or soup can) weight in your left / right hand. Raise your left  / right arm straight into the air so your hand is directly above your shoulder joint. Push the weight into the air so your shoulder (scapula) lifts off the surface that you are lying on. The scapula will push up or forward (protraction). Do not move your head, neck, or back. Hold for 10 seconds. Slowly return to the starting position. Let your muscles relax completely before you repeat this exercise.  Repeat 10 times. Complete this exercise 3 times per week.             Knee Exercises  Ask your health care provider which exercises are safe for you. Do exercises exactly as told by your health care provider and adjust them as directed. It is normal to feel mild stretching, pulling, tightness, or discomfort as you do these exercises. Stop right away if you feel sudden pain or your pain gets worse. Do not begin these exercises until told by your health care provider.  Stretching and range-of-motion exercises These exercises warm up your muscles and joints and improve the movement and flexibility of your knee. These exercises also help to relieve pain and swelling.  Knee extension, prone Lie on your abdomen (prone position) on a bed. Place your left / right knee just beyond the edge of the surface so your knee is not on the bed. You can put a towel under your left / right thigh just above your kneecap for comfort. Relax your leg muscles and allow gravity to straighten your knee (extension). You should feel a stretch behind your left / right knee. Hold this position for 10 seconds. Scoot up so your knee is supported between repetitions. Repeat 10 times. Complete this exercise 3-4 times per week.     Knee flexion, active Lie on your back with both  legs straight. If this causes back discomfort, bend your left / right knee so your foot is flat on the floor. Slowly slide your left / right heel back toward your buttocks. Stop when you feel a gentle stretch in the front of your knee or thigh  (flexion). Hold this position for 10 seconds. Slowly slide your left / right heel back to the starting position. Repeat 10 times. Complete this exercise 3-4 times per week.      Quadriceps stretch, prone Lie on your abdomen on a firm surface, such as a bed or padded floor. Bend your left / right knee and hold your ankle. If you cannot reach your ankle or pant leg, loop a belt around your foot and grab the belt instead. Gently pull your heel toward your buttocks. Your knee should not slide out to the side. You should feel a stretch in the front of your thigh and knee (quadriceps). Hold this position for 10 seconds. Repeat 10 times. Complete this exercise 3-4 times per week.      Hamstring, supine Lie on your back (supine position). Loop a belt or towel over the ball of your left / right foot. The ball of your foot is on the walking surface, right under your toes. Straighten your left / right knee and slowly pull on the belt to raise your leg until you feel a gentle stretch behind your knee (hamstring). Do not let your knee bend while you do this. Keep your other leg flat on the floor. Hold this position for 10 seconds. Repeat 10 times. Complete this exercise 3-4 times per week.   Strengthening exercises These exercises build strength and endurance in your knee. Endurance is the ability to use your muscles for a long time, even after they get tired.  Quadriceps, isometric This exercise stretches the muscles in front of your thigh (quadriceps) without moving your knee joint (isometric). Lie on your back with your left / right leg extended and your other knee bent. Put a rolled towel or small pillow under your knee if told by your health care provider. Slowly tense the muscles in the front of your left / right thigh. You should see your kneecap slide up toward your hip or see increased dimpling just above the knee. This motion will push the back of the knee toward the floor. For 10  seconds, hold the muscle as tight as you can without increasing your pain. Relax the muscles slowly and completely. Repeat 10 times. Complete this exercise 3-4 times per week. .     Straight leg raises This exercise stretches the muscles in front of your thigh (quadriceps) and the muscles that move your hips (hip flexors). Lie on your back with your left / right leg extended and your other knee bent. Tense the muscles in the front of your left / right thigh. You should see your kneecap slide up or see increased dimpling just above the knee. Your thigh may even shake a bit. Keep these muscles tight as you raise your leg 4-6 inches (10-15 cm) off the floor. Do not let your knee bend. Hold this position for 10 seconds. Keep these muscles tense as you lower your leg. Relax your muscles slowly and completely after each repetition. Repeat 10 times. Complete this exercise 3-4 times per week.  Hamstring, isometric Lie on your back on a firm surface. Bend your left / right knee about 30 degrees. Dig your left / right heel into the surface as if  you are trying to pull it toward your buttocks. Tighten the muscles in the back of your thighs (hamstring) to dig as hard as you can without increasing any pain. Hold this position for 10 seconds. Release the tension gradually and allow your muscles to relax completely for __________ seconds after each repetition. Repeat 10 times. Complete this exercise 3-4 times per week.  Hamstring curls If told by your health care provider, do this exercise while wearing ankle weights. Begin with 5 lb weights. Then increase the weight by 1 lb (0.5 kg) increments. You can also use an exercise band Lie on your abdomen with your legs straight. Bend your left / right knee as far as you can without feeling pain. Keep your hips flat against the floor. Hold this position for 10 seconds. Slowly lower your leg to the starting position. Repeat 10 times. Complete this exercise  3-4 times per week.      Squats This exercise strengthens the muscles in front of your thigh and knee (quadriceps). Stand in front of a table, with your feet and knees pointing straight ahead. You may rest your hands on the table for balance but not for support. Slowly bend your knees and lower your hips like you are going to sit in a chair. Keep your weight over your heels, not over your toes. Keep your lower legs upright so they are parallel with the table legs. Do not let your hips go lower than your knees. Do not bend lower than told by your health care provider. If your knee pain increases, do not bend as low. Hold the squat position for 10 seconds. Slowly push with your legs to return to standing. Do not use your hands to pull yourself to standing. Repeat 10 times. Complete this exercise 3-4 times per week .     Wall slides This exercise strengthens the muscles in front of your thigh and knee (quadriceps). Lean your back against a smooth wall or door, and walk your feet out 18-24 inches (46-61 cm) from it. Place your feet hip-width apart. Slowly slide down the wall or door until your knees bend 90 degrees. Keep your knees over your heels, not over your toes. Keep your knees in line with your hips. Hold this position for 10 seconds. Repeat 10 times. Complete this exercise 3-4 times per week.      Straight leg raises This exercise strengthens the muscles that rotate the leg at the hip and move it away from your body (hip abductors). Lie on your side with your left / right leg in the top position. Lie so your head, shoulder, knee, and hip line up. You may bend your bottom knee to help you keep your balance. Roll your hips slightly forward so your hips are stacked directly over each other and your left / right knee is facing forward. Leading with your heel, lift your top leg 4-6 inches (10-15 cm). You should feel the muscles in your outer hip lifting. Do not let your foot drift  forward. Do not let your knee roll toward the ceiling. Hold this position for 10 seconds. Slowly return your leg to the starting position. Let your muscles relax completely after each repetition. Repeat 10 times. Complete this exercise 3-4 times per week.      Straight leg raises This exercise stretches the muscles that move your hips away from the front of the pelvis (hip extensors). Lie on your abdomen on a firm surface. You can put a pillow  under your hips if that is more comfortable. Tense the muscles in your buttocks and lift your left / right leg about 4-6 inches (10-15 cm). Keep your knee straight as you lift your leg. Hold this position for 10 seconds. Slowly lower your leg to the starting position. Let your leg relax completely after each repetition. Repeat 10 times. Complete this exercise 3-4 times per week.

## 2024-09-18 NOTE — Progress Notes (Signed)
 New Patient Visit  Assessment: Olivia Patterson is a 48 y.o. female with the following: 1. Chronic pain of left knee 2. Chronic left shoulder pain  Plan: Olivia Patterson continues to have pain in her left shoulder, after a fall sustained several months ago while skiing.  She has slightly decreased range of motion.  Tenderness over the anterior and lateral aspect of the left shoulder.  Radiographs are without acute injury.  No signs of chronic injury.  We discussed multiple treatment options.  She would like to proceed with an injection.  This was completed in clinic today.  Regarding the left knee, radiographs are negative.  She states that the knee feels okay overall.  It does not bother her as much as the left shoulder.  Continue with medications as needed.  Can consider physical therapy.  Follow-up as needed.   Procedure note injection Left shoulder    Verbal consent was obtained to inject the left shoulder, subacromial space Timeout was completed to confirm the site of injection.  The skin was prepped with alcohol and ethyl chloride was sprayed at the injection site.  A 21-gauge needle was used to inject 40 mg of Depo-Medrol  and 1% lidocaine (4 cc) into the subacromial space of the left shoulder using a posterolateral approach.  There were no complications. A sterile bandage was applied.   Follow-up: Return if symptoms worsen or fail to improve.  Subjective:  Chief Complaint  Patient presents with   Left knee and left shoulder pain    Left shoulder pain x 8 months -fall while skiing  Chronic left knee pain -no specific injury.    History of Present Illness: Olivia Patterson is a 48 y.o. female who has been referred by  Luke Shade, MD for evaluation of left shoulder and left knee pain.  Her left shoulder is bothering her more.  She sustained an injury, after a fall while skiing, approximately 8 months ago.  She lost her balance, and landed on an outstretched left shoulder.   She has had some discomfort, she was hoping to get better.  She has taken pain medications intermittently.  No physical therapy.  Regarding the left knee, she notes pain in the anterior aspect of the knee off-and-on for the past year.  No specific injury.  She has noticed some swelling, and some stiffness.  Occasional giving out sensations.  She has not noticed any locking.   Review of Systems: No fevers or chills No numbness or tingling No chest pain No shortness of breath No bowel or bladder dysfunction No GI distress No headaches   Medical History:  Past Medical History:  Diagnosis Date   LGSIL on Pap smear of cervix    SVT (supraventricular tachycardia)     Past Surgical History:  Procedure Laterality Date   BARIATRIC SURGERY     2013   WISDOM TOOTH EXTRACTION      Family History  Problem Relation Age of Onset   Breast cancer Mother 74   Diabetes Father    Healthy Child    Social History   Tobacco Use   Smoking status: Former   Smokeless tobacco: Never  Advertising account planner   Vaping status: Never Used  Substance Use Topics   Alcohol use: Not Currently    Comment: [place]   Drug use: No    No Known Allergies  Current Meds  Medication Sig   levonorgestrel  (LILETTA ) 18.6 MCG/DAY IUD IUD 1 each by Intrauterine route once.  meloxicam  (MOBIC ) 7.5 MG tablet Take 1 tablet (7.5 mg total) by mouth daily.   metoprolol  tartrate (LOPRESSOR ) 25 MG tablet Take 12.5 mg by mouth 2 (two) times daily.   SEMAGLUTIDE-WEIGHT MANAGEMENT Otis Inject into the skin.   valACYclovir  (VALTREX ) 500 MG tablet TAKE 1 TABLET(500 MG) BY MOUTH TWICE DAILY FOR 3 DAYS    Objective: There were no vitals taken for this visit.  Physical Exam:  General: Alert and oriented. and No acute distress. Gait: Left sided antalgic gait.  Left shoulder without deformity.  No swelling.  Some point tenderness over the anterior and lateral aspect of the left shoulder.  Slightly restricted range of motion.   Sensation intact distally.  She has good strength.  Left knee is without swelling.  No redness.  No point tenderness.  She has good range of motion.  Negative Lachman.  No increased laxity varus or valgus stress.  IMAGING: I personally reviewed images previously obtained in clinic  X-rays of both the left knee and the left shoulder were available in clinic today.  No acute injuries are noted.  No signs of chronic injury.  Minimal degenerative changes.   New Medications:  No orders of the defined types were placed in this encounter.     Oneil Olivia Horde, MD  09/18/2024 10:54 PM

## 2024-10-26 ENCOUNTER — Encounter: Payer: Self-pay | Admitting: Radiology

## 2024-12-30 ENCOUNTER — Telehealth: Payer: Self-pay

## 2024-12-30 NOTE — Telephone Encounter (Signed)
 I left voicemail for patient asking her to please call us  to reschedule her 01/25/2025 appointment with Dr. Luke Shade, as she will be out of the office.  I also sent a message to patient via MyChart.  E2C2 - when patient calls back, please assist her with rescheduling her appointment.

## 2025-01-25 ENCOUNTER — Ambulatory Visit

## 2025-03-29 ENCOUNTER — Ambulatory Visit

## 2025-07-27 ENCOUNTER — Ambulatory Visit: Admitting: Dermatology
